# Patient Record
Sex: Female | Born: 1940 | Race: White | Hispanic: No | Smoking: Never smoker
Health system: Southern US, Community
[De-identification: ages and names within clinical notes are randomized; demographics above are authoritative.]

## PROBLEM LIST (undated history)

## (undated) DIAGNOSIS — Z9889 Other specified postprocedural states: Secondary | ICD-10-CM

## (undated) DIAGNOSIS — J9801 Acute bronchospasm: Secondary | ICD-10-CM

## (undated) DIAGNOSIS — E039 Hypothyroidism, unspecified: Secondary | ICD-10-CM

## (undated) DIAGNOSIS — I10 Essential (primary) hypertension: Secondary | ICD-10-CM

## (undated) DIAGNOSIS — R519 Headache, unspecified: Secondary | ICD-10-CM

## (undated) DIAGNOSIS — J449 Chronic obstructive pulmonary disease, unspecified: Secondary | ICD-10-CM

## (undated) DIAGNOSIS — M8580 Other specified disorders of bone density and structure, unspecified site: Secondary | ICD-10-CM

## (undated) HISTORY — DX: Essential (primary) hypertension: I10

## (undated) HISTORY — DX: Hypothyroidism, unspecified: E03.9

## (undated) HISTORY — DX: Other specified disorders of bone density and structure, unspecified site: M85.80

## (undated) HISTORY — DX: Chronic obstructive pulmonary disease, unspecified: J44.9

## (undated) HISTORY — PX: COLONOSCOPY: SHX174

## (undated) HISTORY — DX: Acute bronchospasm: J98.01

## (undated) HISTORY — PX: DILATION AND CURETTAGE OF UTERUS: SHX78

---

## 2010-03-16 ENCOUNTER — Ambulatory Visit
Admission: RE | Admit: 2010-03-16 | Discharge: 2010-03-16 | Payer: Self-pay | Source: Home / Self Care | Attending: Internal Medicine | Admitting: Internal Medicine

## 2010-03-16 ENCOUNTER — Ambulatory Visit: Admit: 2010-03-16 | Payer: Self-pay | Admitting: Internal Medicine

## 2010-03-28 ENCOUNTER — Other Ambulatory Visit: Payer: Self-pay

## 2010-03-28 ENCOUNTER — Ambulatory Visit (INDEPENDENT_AMBULATORY_CARE_PROVIDER_SITE_OTHER): Payer: Medicare HMO | Admitting: Internal Medicine

## 2010-03-28 ENCOUNTER — Other Ambulatory Visit: Payer: Self-pay | Admitting: Internal Medicine

## 2010-03-28 ENCOUNTER — Other Ambulatory Visit (HOSPITAL_COMMUNITY)
Admission: RE | Admit: 2010-03-28 | Discharge: 2010-03-28 | Disposition: A | Discharge status: Home / Self Care | Payer: Medicare HMO | Source: Ambulatory Visit | Attending: Internal Medicine | Admitting: Internal Medicine

## 2010-03-28 DIAGNOSIS — Z139 Encounter for screening, unspecified: Secondary | ICD-10-CM

## 2010-03-28 DIAGNOSIS — Z01419 Encounter for gynecological examination (general) (routine) without abnormal findings: Secondary | ICD-10-CM | POA: Insufficient documentation

## 2010-03-28 DIAGNOSIS — Z1159 Encounter for screening for other viral diseases: Secondary | ICD-10-CM | POA: Insufficient documentation

## 2010-04-27 ENCOUNTER — Ambulatory Visit (HOSPITAL_BASED_OUTPATIENT_CLINIC_OR_DEPARTMENT_OTHER)
Admission: RE | Admit: 2010-04-27 | Discharge: 2010-04-27 | Disposition: A | Discharge status: Home / Self Care | Payer: Medicare HMO | Source: Ambulatory Visit | Attending: Internal Medicine | Admitting: Internal Medicine

## 2010-04-27 DIAGNOSIS — Z139 Encounter for screening, unspecified: Secondary | ICD-10-CM

## 2010-04-27 DIAGNOSIS — Z1231 Encounter for screening mammogram for malignant neoplasm of breast: Secondary | ICD-10-CM | POA: Insufficient documentation

## 2010-07-29 ENCOUNTER — Encounter: Payer: Self-pay | Admitting: Internal Medicine

## 2010-10-03 ENCOUNTER — Other Ambulatory Visit: Payer: Self-pay | Admitting: Internal Medicine

## 2010-10-03 DIAGNOSIS — I10 Essential (primary) hypertension: Secondary | ICD-10-CM

## 2010-10-03 MED ORDER — LOSARTAN POTASSIUM-HCTZ 100-25 MG PO TABS: 1.0000 | ORAL_TABLET | Freq: Every day | ORAL | Status: DC

## 2010-10-03 MED ORDER — AMLODIPINE BESYLATE 2.5 MG PO TABS: 2.5000 mg | ORAL_TABLET | Freq: Every day | ORAL | Status: DC

## 2010-10-03 NOTE — Telephone Encounter (Signed)
Pt requests #90 with 3 refills to mail order pharmacy.  She also needs #5 of Losartan called to local pharamacy

## 2010-10-03 NOTE — Telephone Encounter (Signed)
Pt request refills on Lofartan/HCTZ 100-25 mg & Amiodipine 2.5 mg .  Would like 3 refills for 90 day prescription.  Uses [facility] mail order pharmacy.  Phone number [phone] & fax number [phone].  Also would like Lofartan/HCTZ 5 tablets called into [facility] [phone].  Pt phone number [phone].

## 2010-10-03 NOTE — Telephone Encounter (Signed)
Losartan/HCTZ 100/25mg  #[facility] called to [facility] per pt request

## 2010-10-10 ENCOUNTER — Telehealth: Payer: Self-pay | Admitting: Internal Medicine

## 2010-10-10 DIAGNOSIS — I10 Essential (primary) hypertension: Secondary | ICD-10-CM

## 2010-10-10 MED ORDER — LOSARTAN POTASSIUM-HCTZ 100-25 MG PO TABS: 1.0000 | ORAL_TABLET | Freq: Every day | ORAL | Status: DC

## 2010-10-10 NOTE — Telephone Encounter (Signed)
Pt needs 10 days supply Lofartan HCTZ 100-25 mg.  [facility] prescription is on delay they did an override.  [facility] phone number [phone].  Phone number to call pt back [phone].

## 2011-01-30 ENCOUNTER — Encounter: Payer: Self-pay | Admitting: Emergency Medicine

## 2011-01-30 ENCOUNTER — Ambulatory Visit (INDEPENDENT_AMBULATORY_CARE_PROVIDER_SITE_OTHER): Payer: Medicare HMO | Admitting: Emergency Medicine

## 2011-01-30 VITALS — BP 131/72 | HR 71 | Temp 97.0°F | Ht 67.5 in | Wt 113.0 lb

## 2011-01-30 DIAGNOSIS — Z23 Encounter for immunization: Secondary | ICD-10-CM

## 2011-01-30 NOTE — Progress Notes (Signed)
Cautioned Diane Bauer that it will take 2 weeks for full immune effect of vaccination.  Cautioned her to stay away from sick people, anyone with flu like symptoms- use good hand washing as her best protection.  She verbalized understanding

## 2011-03-21 ENCOUNTER — Other Ambulatory Visit: Payer: Self-pay | Admitting: Internal Medicine

## 2011-03-21 DIAGNOSIS — Z1231 Encounter for screening mammogram for malignant neoplasm of breast: Secondary | ICD-10-CM

## 2011-03-30 ENCOUNTER — Other Ambulatory Visit: Payer: Self-pay | Admitting: Internal Medicine

## 2011-04-05 ENCOUNTER — Telehealth: Payer: Self-pay | Admitting: Emergency Medicine

## 2011-04-05 NOTE — Telephone Encounter (Signed)
Diane Bauer called stating she had spoken with the internal medicine clinic at [facility] where she was seen previously.  She was told that the testing that needs to be done when a patient is on Armour thyroid is Free T4 STI, not TSH.  She is coming in on Friday for repeat labs.  What to draw?  Should I call Dr. Willeen Cass office to double check what they recommend drawing?  I did talk with her about going to see an endocrinologist, likely Dr. Talmage Nap, and she was fine with the idea, agreeing it was probably a good idea for her to see one.

## 2011-04-06 ENCOUNTER — Encounter: Payer: Medicare HMO | Admitting: Internal Medicine

## 2011-04-06 LAB — COMPREHENSIVE METABOLIC PANEL
AST: 31 U/L (ref 0–37)
Albumin: 4.5 g/dL (ref 3.5–5.2)
BUN: 12 mg/dL (ref 6–23)
CO2: 28 mEq/L (ref 19–32)
Calcium: 9.8 mg/dL (ref 8.4–10.5)
Chloride: 99 mEq/L (ref 96–112)
Glucose, Bld: 83 mg/dL (ref 70–99)
Potassium: 3.2 mEq/L — ABNORMAL LOW (ref 3.5–5.3)

## 2011-04-06 LAB — CBC WITH DIFFERENTIAL/PLATELET
HCT: 42.7 % (ref 36.0–46.0)
Hemoglobin: 14.3 g/dL (ref 12.0–15.0)
Lymphocytes Relative: 36 % (ref 12–46)
Lymphs Abs: 1.7 10*3/uL (ref 0.7–4.0)
Monocytes Absolute: 0.4 10*3/uL (ref 0.1–1.0)
Monocytes Relative: 8 % (ref 3–12)
Neutro Abs: 2.4 10*3/uL (ref 1.7–7.7)
WBC: 4.7 10*3/uL (ref 4.0–10.5)

## 2011-04-06 LAB — VITAMIN D 25 HYDROXY (VIT D DEFICIENCY, FRACTURES): Vit D, 25-Hydroxy: 40 ng/mL (ref 30–89)

## 2011-04-06 LAB — LIPID PANEL: LDL Cholesterol: 88 mg/dL (ref 0–99)

## 2011-04-06 LAB — TSH: TSH: 0.043 u[IU]/mL — ABNORMAL LOW (ref 0.350–4.500)

## 2011-04-06 NOTE — Telephone Encounter (Signed)
Go ahead and order Free T4 and Free T3 and thyroxine index.  Ok to repeat all former labs along with a fasting lipid profile.

## 2011-04-07 ENCOUNTER — Other Ambulatory Visit: Payer: Medicare HMO | Admitting: Emergency Medicine

## 2011-04-07 DIAGNOSIS — E039 Hypothyroidism, unspecified: Secondary | ICD-10-CM

## 2011-04-07 DIAGNOSIS — I10 Essential (primary) hypertension: Secondary | ICD-10-CM

## 2011-04-07 DIAGNOSIS — M8580 Other specified disorders of bone density and structure, unspecified site: Secondary | ICD-10-CM

## 2011-04-07 LAB — COMPREHENSIVE METABOLIC PANEL
ALT: 15 U/L (ref 0–35)
BUN: 11 mg/dL (ref 6–23)
CO2: 27 mEq/L (ref 19–32)
Calcium: 9.4 mg/dL (ref 8.4–10.5)
Creat: 0.77 mg/dL (ref 0.50–1.10)
Glucose, Bld: 82 mg/dL (ref 70–99)
Total Bilirubin: 1.6 mg/dL — ABNORMAL HIGH (ref 0.3–1.2)

## 2011-04-07 LAB — CBC WITH DIFFERENTIAL/PLATELET
Eosinophils Absolute: 0.1 10*3/uL (ref 0.0–0.7)
Eosinophils Relative: 4 % (ref 0–5)
HCT: 41.5 % (ref 36.0–46.0)
Lymphs Abs: 1.3 10*3/uL (ref 0.7–4.0)
MCH: 32.3 pg (ref 26.0–34.0)
MCV: 95.2 fL (ref 78.0–100.0)
Monocytes Absolute: 0.4 10*3/uL (ref 0.1–1.0)
Platelets: 173 10*3/uL (ref 150–400)
RBC: 4.36 MIL/uL (ref 3.87–5.11)

## 2011-04-07 LAB — LIPID PANEL
HDL: 72 mg/dL (ref >39)
LDL Cholesterol: 77 mg/dL (ref 0–99)
Total CHOL/HDL Ratio: 2.2 Ratio

## 2011-04-07 NOTE — Telephone Encounter (Signed)
Diane Bauer in the office this morning for labs

## 2011-04-08 LAB — FREE THYROXINE INDEX: Free Thyroxine Index: 3.9 (ref 1.0–3.9)

## 2011-04-08 LAB — VITAMIN D 25 HYDROXY (VIT D DEFICIENCY, FRACTURES): Vit D, 25-Hydroxy: 38 ng/mL (ref 30–89)

## 2011-04-08 LAB — T3, FREE: T3, Free: 5.4 pg/mL — ABNORMAL HIGH (ref 2.3–4.2)

## 2011-04-08 LAB — T4, FREE: Free T4: 1.33 ng/dL (ref 0.80–1.80)

## 2011-04-08 LAB — T3 UPTAKE: T3 Uptake: 34.9 % (ref 22.5–37.0)

## 2011-04-12 ENCOUNTER — Encounter: Payer: Self-pay | Admitting: Internal Medicine

## 2011-04-12 ENCOUNTER — Ambulatory Visit (INDEPENDENT_AMBULATORY_CARE_PROVIDER_SITE_OTHER): Payer: Medicare HMO | Admitting: Internal Medicine

## 2011-04-12 VITALS — BP 144/78 | HR 79 | Temp 97.2°F | Resp 12 | Ht 66.75 in | Wt 114.0 lb

## 2011-04-12 DIAGNOSIS — E039 Hypothyroidism, unspecified: Secondary | ICD-10-CM | POA: Insufficient documentation

## 2011-04-12 DIAGNOSIS — M899 Disorder of bone, unspecified: Secondary | ICD-10-CM

## 2011-04-12 DIAGNOSIS — R918 Other nonspecific abnormal finding of lung field: Secondary | ICD-10-CM

## 2011-04-12 DIAGNOSIS — M8580 Other specified disorders of bone density and structure, unspecified site: Secondary | ICD-10-CM | POA: Insufficient documentation

## 2011-04-12 DIAGNOSIS — R9389 Abnormal findings on diagnostic imaging of other specified body structures: Secondary | ICD-10-CM | POA: Insufficient documentation

## 2011-04-12 DIAGNOSIS — I10 Essential (primary) hypertension: Secondary | ICD-10-CM

## 2011-04-12 LAB — POCT URINALYSIS DIPSTICK
Ketones, UA: NEGATIVE
Protein, UA: NEGATIVE
Spec Grav, UA: <=1.005
pH, UA: 6.5

## 2011-04-12 NOTE — Patient Instructions (Signed)
Bring outpatient Bp to myoffice  See me in 3-4 months to check BP  Will refer to Dr. Talmage Nap endocrinology

## 2011-04-12 NOTE — Progress Notes (Signed)
Subjective:    Patient ID: Diane Bauer, female    DOB: 1940/02/21, 71 y.o.   MRN: [id number]  HPI  Diane Bauer is here for a comprehensive eval.  She  Is doing well and is up to date with screening exams  She reports her BP at home which she checks once a week is below 140 most of time.  She is unwilling to change the dose of her BP meds.  No Known Allergies Past Medical History  Diagnosis Date  . Hypertension   . Hypothyroidism   . Osteopenia   . COPD (chronic obstructive pulmonary disease)     by CXR  . Bronchospasm     occassional   Past Surgical History  Procedure Date  . Dilation and curettage of uterus     x2, secondary to menorrhagia   History   Social History  . Marital Status: Divorced    Spouse Name: N/A    Number of Children: 1  . Years of Education: college   Occupational History  . Freight forwarder    Social History Main Topics  . Smoking status: Never Smoker   . Smokeless tobacco: Never Used  . Alcohol Use: No  . Drug Use: No  . Sexually Active: Not Currently -- Female partner(s)   Other Topics Concern  . Not on file   Social History Narrative  . No narrative on file   Family History  Problem Relation Age of Onset  . Breast cancer Sister   . Breast cancer Paternal Aunt    Patient Active Problem List  Diagnoses  . Hypertension  . Hypothyroidism  . Osteopenia  . Abnormal CXR   Current Outpatient Prescriptions on File Prior to Visit  Medication Sig Dispense Refill  . amLODipine (NORVASC) 2.5 MG tablet Take 1 tablet (2.5 mg total) by mouth daily.  90 tablet  1  . aspirin 81 MG tablet Take 81 mg by mouth daily.        . cholecalciferol (VITAMIN D) 1000 UNITS tablet Take 1,000 Units by mouth daily.        Marland Kitchen losartan-hydrochlorothiazide (HYZAAR) 100-25 MG per tablet Take 1 tablet by mouth daily.  90 tablet  1  . Multiple Vitamin (MULTIVITAMIN) tablet Take 1 tablet by mouth daily.        Marland Kitchen thyroid (ARMOUR THYROID) 60 MG tablet Take 60 mg by mouth  2 (two) times daily.        . vitamin B-12 (CYANOCOBALAMIN) 50 MCG tablet Take 50 mcg by mouth daily.              Review of Systems  Constitutional: Negative for appetite change and unexpected weight change.  HENT: Negative for hearing loss, ear pain and tinnitus.   Eyes: Negative for pain, redness and visual disturbance.  Respiratory: Negative for cough, chest tightness and shortness of breath.   Cardiovascular: Negative for chest pain, palpitations and leg swelling.  Gastrointestinal: Negative for abdominal pain and blood in stool.  Genitourinary: Negative for dysuria, difficulty urinating and pelvic pain.  Musculoskeletal: Negative for joint swelling and arthralgias.  Neurological: Negative for weakness and headaches.       Objective:   Physical Exam Physical Exam  Nursing note and vitals reviewed.  Constitutional: She is oriented to person, place, and time. She appears well-developed and well-nourished.  HENT:  Head: Normocephalic and atraumatic.  Right Ear: Tympanic membrane and ear canal normal. No drainage. Tympanic membrane is not injected and not erythematous.  Left Ear: Tympanic  membrane and ear canal normal. No drainage. Tympanic membrane is not injected and not erythematous.  Nose: Nose normal. Right sinus exhibits no maxillary sinus tenderness and no frontal sinus tenderness. Left sinus exhibits no maxillary sinus tenderness and no frontal sinus tenderness.  Mouth/Throat: Oropharynx is clear and moist. No oral lesions. No oropharyngeal exudate.  Eyes: Conjunctivae and EOM are normal. Pupils are equal, round, and reactive to light.  Neck: Normal range of motion. Neck supple. No JVD present. Carotid bruit is not present. No mass and no thyromegaly present.  Cardiovascular: Normal rate, regular rhythm, S1 normal, S2 normal and intact distal pulses. Exam reveals no gallop and no friction rub.  No murmur heard.  Pulses:  Carotid pulses are 2+ on the right side, and 2+ on  the left side.  Dorsalis pedis pulses are 2+ on the right side, and 2+ on the left side.  No carotid bruit. No LE edema  Pulmonary/Chest: Breath sounds normal. She has no wheezes. She has no rales. She exhibits no tenderness. Breast no discrete masses no nipple discharge no axillary adenopathy bilaterally Abdominal: Soft. Bowel sounds are normal. She exhibits no distension and no mass. There is no hepatosplenomegaly. There is no tenderness. There is no CVA tenderness.  Rectal no mass guaiac neg Musculoskeletal: Normal range of motion.  No active synovitis to joints.  Lymphadenopathy:  She has no cervical adenopathy.  She has no axillary adenopathy.  Right: No inguinal and no supraclavicular adenopathy present.  Left: No inguinal and no supraclavicular adenopathy present.  Neurological: She is alert and oriented to person, place, and time. She has normal strength and normal reflexes. She displays no tremor. No cranial nerve deficit or sensory deficit. Coordination and gait normal.  Skin: Skin is warm and dry. No rash noted. No cyanosis. Nails show no clubbing.  Psychiatric: She has a normal mood and affect. Her speech is normal and behavior is normal. Cognition and memory are normal.          Assessment & Plan:  1) HTN: I asked pt to bring in record of outpt bp's 2)  Hypothyroid will refer to Dr. Talmage Nap as she is on Armour thyroid for any change in dosing 3)  Osteopenia  willl get DExa next year  Continue CAlcium Vitamin D  I spent 45 minutes with this pt

## 2011-04-17 ENCOUNTER — Other Ambulatory Visit: Payer: Self-pay | Admitting: Emergency Medicine

## 2011-04-17 DIAGNOSIS — I10 Essential (primary) hypertension: Secondary | ICD-10-CM

## 2011-04-17 MED ORDER — AMLODIPINE BESYLATE 2.5 MG PO TABS: 2.5000 mg | ORAL_TABLET | Freq: Every day | ORAL | Status: DC

## 2011-04-17 MED ORDER — LOSARTAN POTASSIUM-HCTZ 100-25 MG PO TABS: 1.0000 | ORAL_TABLET | Freq: Every day | ORAL | Status: DC

## 2011-04-17 NOTE — Telephone Encounter (Signed)
Diane Bauer called this morning to check the status of her medication refills on her blood pressure medications.  She requests a 90 day supply with 3 refills be called to [facility] as she is running low on her medications.  I advised her I would be happy to call in her for pending DDS approval this morning

## 2011-04-23 ENCOUNTER — Other Ambulatory Visit: Payer: Self-pay | Admitting: Internal Medicine

## 2011-04-24 NOTE — Telephone Encounter (Signed)
Spoke with Emmit Alexanders, certified pharmacy tech at [facility].  Given verbal order for both medications.

## 2011-05-01 ENCOUNTER — Ambulatory Visit (HOSPITAL_BASED_OUTPATIENT_CLINIC_OR_DEPARTMENT_OTHER)
Admission: RE | Admit: 2011-05-01 | Discharge: 2011-05-01 | Disposition: A | Discharge status: Home / Self Care | Payer: Medicare HMO | Source: Ambulatory Visit | Attending: Internal Medicine | Admitting: Internal Medicine

## 2011-05-01 DIAGNOSIS — Z1231 Encounter for screening mammogram for malignant neoplasm of breast: Secondary | ICD-10-CM

## 2011-07-05 ENCOUNTER — Encounter: Payer: Self-pay | Admitting: Internal Medicine

## 2011-07-05 ENCOUNTER — Ambulatory Visit (INDEPENDENT_AMBULATORY_CARE_PROVIDER_SITE_OTHER): Payer: Medicare HMO | Admitting: Internal Medicine

## 2011-07-05 VITALS — BP 128/76 | HR 72 | Temp 97.1°F | Resp 16 | Ht 67.0 in | Wt 114.0 lb

## 2011-07-05 DIAGNOSIS — E039 Hypothyroidism, unspecified: Secondary | ICD-10-CM

## 2011-07-05 DIAGNOSIS — I10 Essential (primary) hypertension: Secondary | ICD-10-CM

## 2011-07-05 DIAGNOSIS — R17 Unspecified jaundice: Secondary | ICD-10-CM | POA: Insufficient documentation

## 2011-07-05 NOTE — Progress Notes (Signed)
Subjective:    Patient ID: Diane Bauer, female    DOB: 23-Oct-1940, 71 y.o.   MRN: [id number]  HPI [name] is here for follow up.  She brings her BP moniter and SBP range from 112-140 .  The far majority of days systolic bp is normal  She is asymptomatic.   She has not seen an endocrinologist as yet  No Known Allergies Past Medical History  Diagnosis Date  . Hypertension   . Hypothyroidism   . Osteopenia   . COPD (chronic obstructive pulmonary disease)     by CXR  . Bronchospasm     occassional   Past Surgical History  Procedure Date  . Dilation and curettage of uterus     x2, secondary to menorrhagia   History   Social History  . Marital Status: Divorced    Spouse Name: N/A    Number of Children: 1  . Years of Education: college   Occupational History  . Freight forwarder    Social History Main Topics  . Smoking status: Never Smoker   . Smokeless tobacco: Never Used  . Alcohol Use: No  . Drug Use: No  . Sexually Active: Not Currently -- Female partner(s)   Other Topics Concern  . Not on file   Social History Narrative  . No narrative on file   Family History  Problem Relation Age of Onset  . Breast cancer Sister   . Breast cancer Paternal Aunt    Patient Active Problem List  Diagnoses  . Hypertension  . Hypothyroidism  . Osteopenia  . Abnormal CXR  . Elevated bilirubin   Current Outpatient Prescriptions on File Prior to Visit  Medication Sig Dispense Refill  . amLODipine (NORVASC) 2.5 MG tablet TAKE 1 TABLET BY MOUTH     DAILY  90 tablet  1  . aspirin 81 MG tablet Take 81 mg by mouth daily.        . cholecalciferol (VITAMIN D) 1000 UNITS tablet Take 1,000 Units by mouth daily.        Marland Kitchen losartan-hydrochlorothiazide (HYZAAR) 100-25 MG per tablet TAKE 1 TABLET BY MOUTH     DAILY  90 tablet  1  . thyroid (ARMOUR THYROID) 60 MG tablet Take 60 mg by mouth 2 (two) times daily.        . vitamin B-12 (CYANOCOBALAMIN) 50 MCG tablet Take 50 mcg by mouth daily.         . Multiple Vitamin (MULTIVITAMIN) tablet Take 1 tablet by mouth daily.             Review of Systems    see HPI Objective:   Physical Exam Physical Exam  Nursing note and vitals reviewed.  Constitutional: She is oriented to person, place, and time. She appears well-developed and well-nourished.  HENT:  Head: Normocephalic and atraumatic.  Cardiovascular: Normal rate and regular rhythm. Exam reveals no gallop and no friction rub.  No murmur heard.  Pulmonary/Chest: Breath sounds normal. She has no wheezes. She has no rales.  Neurological: She is alert and oriented to person, place, and time.  Skin: Skin is warm and dry.  Psychiatric: She has a normal mood and affect. Her behavior is normal.  Ext no edema         Assessment & Plan:  10  Htn  Well cntrolled on low dose amlodipine 2) Hypothyroidism on armour thyroid. OK to send to endocrinologist that is one her panel.  She is to check which is on her  insurance.   See me prn

## 2011-07-05 NOTE — Patient Instructions (Signed)
Office visit as needed

## 2011-08-14 ENCOUNTER — Ambulatory Visit: Payer: Medicare HMO | Admitting: Internal Medicine

## 2011-10-02 ENCOUNTER — Other Ambulatory Visit: Payer: Self-pay | Admitting: Internal Medicine

## 2011-10-02 DIAGNOSIS — E039 Hypothyroidism, unspecified: Secondary | ICD-10-CM

## 2011-10-16 ENCOUNTER — Other Ambulatory Visit: Payer: Self-pay | Admitting: Internal Medicine

## 2011-10-16 MED ORDER — LOSARTAN POTASSIUM-HCTZ 100-25 MG PO TABS: 90.0000 | ORAL_TABLET | Freq: Every day | ORAL | Status: DC

## 2011-10-16 MED ORDER — AMLODIPINE BESYLATE 2.5 MG PO TABS: 2.5000 mg | ORAL_TABLET | Freq: Every day | ORAL | Status: AC

## 2011-10-16 NOTE — Telephone Encounter (Signed)
Left message with pt to call office regarding thyroid medicine

## 2011-10-16 NOTE — Telephone Encounter (Signed)
Pt needs refill for 3 prescriptions.  For Losartan/HCTZ 100/25, Amolodipine Besylate 2.5 mg & Armour 60 mg twice a day.  Needs to be called into [facility] [phone] option 3.  Needs 2 90 day supply prescriptions.  Pt phone number [phone].

## 2011-10-17 ENCOUNTER — Telehealth: Payer: Self-pay | Admitting: Internal Medicine

## 2011-10-17 MED ORDER — THYROID 65 MG PO TABS: ORAL_TABLET | ORAL | Status: DC

## 2011-10-17 NOTE — Telephone Encounter (Signed)
Spoke with pt.  .  Dr. Evlyn Kanner not able to see pt.  Will refer to Dr. Warden Fillers in [place].  I advised pt that I will need to lower dose of Armour Thyroid  OK to give 3 month supply

## 2011-10-29 NOTE — Telephone Encounter (Signed)
error 

## 2011-10-30 ENCOUNTER — Telehealth: Payer: Self-pay | Admitting: Internal Medicine

## 2011-10-30 NOTE — Telephone Encounter (Signed)
Pt needs refills to [facility]: AmLODIPine Besylate (Tab) NORVASC 2.5 MG Take 1 tablet (2.5 mg total) by mouth daily. Losartan Potassium-HCTZ (Tab) HYZAAR 100-25 MG Take 90 tablets by mouth daily.    Per pt she is down to her last ten tablets... If there any questions or concerns please call pt.Marland KitchenMarland Kitchen

## 2011-11-07 ENCOUNTER — Other Ambulatory Visit: Payer: Self-pay | Admitting: Internal Medicine

## 2011-11-07 ENCOUNTER — Telehealth: Payer: Self-pay | Admitting: Internal Medicine

## 2011-11-07 NOTE — Telephone Encounter (Signed)
Pt is expressing concerns of why her Losartan Potassium-HCTZ (Tab) HYZAAR 100-25 MG Take 90 tablets by mouth daily. Has not been fill with [facility]... She states that the [facility] fax forms to office to have them fax back immediately... Pt is down to her last week of blood pressure.... Pt is asking for a call [phone]... Ad

## 2011-11-07 NOTE — Telephone Encounter (Signed)
Called pt regarding her refills as well as [facility], refills for norvasc and losartin verified and and called in.

## 2011-11-07 NOTE — Telephone Encounter (Signed)
Ok to call this in 

## 2011-11-13 ENCOUNTER — Other Ambulatory Visit: Payer: Self-pay | Admitting: *Deleted

## 2011-11-13 NOTE — Telephone Encounter (Signed)
Assured pt that medication had been called in

## 2011-11-13 NOTE — Telephone Encounter (Signed)
[facility] notified refill for 15 losartin called in

## 2011-12-24 ENCOUNTER — Other Ambulatory Visit: Payer: Self-pay | Admitting: Internal Medicine

## 2012-03-27 ENCOUNTER — Other Ambulatory Visit (HOSPITAL_BASED_OUTPATIENT_CLINIC_OR_DEPARTMENT_OTHER): Payer: Self-pay | Admitting: Family Medicine

## 2012-03-27 DIAGNOSIS — Z1231 Encounter for screening mammogram for malignant neoplasm of breast: Secondary | ICD-10-CM

## 2012-05-01 ENCOUNTER — Ambulatory Visit (HOSPITAL_BASED_OUTPATIENT_CLINIC_OR_DEPARTMENT_OTHER)
Admission: RE | Admit: 2012-05-01 | Discharge: 2012-05-01 | Disposition: A | Discharge status: Home / Self Care | Payer: Medicare HMO | Source: Ambulatory Visit | Attending: Family Medicine | Admitting: Family Medicine

## 2012-05-01 DIAGNOSIS — Z1231 Encounter for screening mammogram for malignant neoplasm of breast: Secondary | ICD-10-CM | POA: Insufficient documentation

## 2013-04-03 ENCOUNTER — Other Ambulatory Visit (HOSPITAL_BASED_OUTPATIENT_CLINIC_OR_DEPARTMENT_OTHER): Payer: Self-pay | Admitting: Family Medicine

## 2013-04-03 DIAGNOSIS — Z1231 Encounter for screening mammogram for malignant neoplasm of breast: Secondary | ICD-10-CM

## 2013-05-07 ENCOUNTER — Ambulatory Visit (HOSPITAL_BASED_OUTPATIENT_CLINIC_OR_DEPARTMENT_OTHER)
Admission: RE | Admit: 2013-05-07 | Discharge: 2013-05-07 | Disposition: A | Discharge status: Home / Self Care | Payer: Medicare HMO | Source: Ambulatory Visit | Attending: Family Medicine | Admitting: Family Medicine

## 2013-05-07 DIAGNOSIS — Z1231 Encounter for screening mammogram for malignant neoplasm of breast: Secondary | ICD-10-CM | POA: Insufficient documentation

## 2013-12-22 ENCOUNTER — Encounter: Payer: Self-pay | Admitting: Internal Medicine

## 2014-04-13 ENCOUNTER — Other Ambulatory Visit (HOSPITAL_BASED_OUTPATIENT_CLINIC_OR_DEPARTMENT_OTHER): Payer: Self-pay | Admitting: Family Medicine

## 2014-04-13 DIAGNOSIS — Z1231 Encounter for screening mammogram for malignant neoplasm of breast: Secondary | ICD-10-CM

## 2014-05-12 ENCOUNTER — Ambulatory Visit (HOSPITAL_BASED_OUTPATIENT_CLINIC_OR_DEPARTMENT_OTHER)
Admission: RE | Admit: 2014-05-12 | Discharge: 2014-05-12 | Disposition: A | Discharge status: Home / Self Care | Payer: Medicare HMO | Source: Ambulatory Visit | Attending: Family Medicine | Admitting: Family Medicine

## 2014-05-12 DIAGNOSIS — Z1231 Encounter for screening mammogram for malignant neoplasm of breast: Secondary | ICD-10-CM | POA: Diagnosis not present

## 2015-04-08 ENCOUNTER — Other Ambulatory Visit (HOSPITAL_BASED_OUTPATIENT_CLINIC_OR_DEPARTMENT_OTHER): Payer: Self-pay | Admitting: Family Medicine

## 2015-04-08 DIAGNOSIS — Z1231 Encounter for screening mammogram for malignant neoplasm of breast: Secondary | ICD-10-CM

## 2015-05-17 ENCOUNTER — Ambulatory Visit (HOSPITAL_BASED_OUTPATIENT_CLINIC_OR_DEPARTMENT_OTHER)
Admission: RE | Admit: 2015-05-17 | Discharge: 2015-05-17 | Disposition: A | Discharge status: Home / Self Care | Payer: Medicare HMO | Source: Ambulatory Visit | Attending: Family Medicine | Admitting: Family Medicine

## 2015-05-17 DIAGNOSIS — Z1231 Encounter for screening mammogram for malignant neoplasm of breast: Secondary | ICD-10-CM | POA: Diagnosis not present

## 2016-04-21 ENCOUNTER — Other Ambulatory Visit (HOSPITAL_BASED_OUTPATIENT_CLINIC_OR_DEPARTMENT_OTHER): Payer: Self-pay | Admitting: Family Medicine

## 2016-04-21 DIAGNOSIS — Z1231 Encounter for screening mammogram for malignant neoplasm of breast: Secondary | ICD-10-CM

## 2016-05-25 ENCOUNTER — Ambulatory Visit (HOSPITAL_BASED_OUTPATIENT_CLINIC_OR_DEPARTMENT_OTHER)
Admission: RE | Admit: 2016-05-25 | Discharge: 2016-05-25 | Disposition: A | Discharge status: Home / Self Care | Payer: Medicare HMO | Source: Ambulatory Visit | Attending: Family Medicine | Admitting: Family Medicine

## 2016-05-25 DIAGNOSIS — Z1231 Encounter for screening mammogram for malignant neoplasm of breast: Secondary | ICD-10-CM | POA: Insufficient documentation

## 2016-08-31 ENCOUNTER — Ambulatory Visit (INDEPENDENT_AMBULATORY_CARE_PROVIDER_SITE_OTHER): Payer: Medicare HMO | Admitting: Psychology

## 2016-08-31 DIAGNOSIS — F411 Generalized anxiety disorder: Secondary | ICD-10-CM

## 2017-04-17 ENCOUNTER — Other Ambulatory Visit (HOSPITAL_BASED_OUTPATIENT_CLINIC_OR_DEPARTMENT_OTHER): Payer: Self-pay | Admitting: Family Medicine

## 2017-04-17 DIAGNOSIS — Z1231 Encounter for screening mammogram for malignant neoplasm of breast: Secondary | ICD-10-CM

## 2017-05-30 ENCOUNTER — Encounter (HOSPITAL_BASED_OUTPATIENT_CLINIC_OR_DEPARTMENT_OTHER): Payer: Self-pay

## 2017-05-30 ENCOUNTER — Ambulatory Visit (HOSPITAL_BASED_OUTPATIENT_CLINIC_OR_DEPARTMENT_OTHER)
Admission: RE | Admit: 2017-05-30 | Discharge: 2017-05-30 | Disposition: A | Discharge status: Home / Self Care | Payer: Medicare HMO | Source: Ambulatory Visit | Attending: Family Medicine | Admitting: Family Medicine

## 2017-05-30 DIAGNOSIS — Z1231 Encounter for screening mammogram for malignant neoplasm of breast: Secondary | ICD-10-CM | POA: Diagnosis not present

## 2018-04-19 ENCOUNTER — Other Ambulatory Visit (HOSPITAL_BASED_OUTPATIENT_CLINIC_OR_DEPARTMENT_OTHER): Payer: Self-pay | Admitting: Family Medicine

## 2018-04-19 DIAGNOSIS — Z9289 Personal history of other medical treatment: Secondary | ICD-10-CM

## 2018-06-05 ENCOUNTER — Ambulatory Visit (HOSPITAL_BASED_OUTPATIENT_CLINIC_OR_DEPARTMENT_OTHER): Payer: Self-pay

## 2018-10-08 ENCOUNTER — Encounter (HOSPITAL_BASED_OUTPATIENT_CLINIC_OR_DEPARTMENT_OTHER): Payer: Self-pay

## 2018-10-08 ENCOUNTER — Ambulatory Visit (HOSPITAL_BASED_OUTPATIENT_CLINIC_OR_DEPARTMENT_OTHER)
Admission: RE | Admit: 2018-10-08 | Discharge: 2018-10-08 | Disposition: A | Discharge status: Home / Self Care | Payer: Medicare HMO | Source: Ambulatory Visit | Attending: Family Medicine | Admitting: Family Medicine

## 2018-10-08 ENCOUNTER — Other Ambulatory Visit: Payer: Self-pay

## 2018-10-08 DIAGNOSIS — R928 Other abnormal and inconclusive findings on diagnostic imaging of breast: Secondary | ICD-10-CM | POA: Diagnosis not present

## 2018-10-08 DIAGNOSIS — Z1231 Encounter for screening mammogram for malignant neoplasm of breast: Secondary | ICD-10-CM | POA: Diagnosis present

## 2018-10-08 DIAGNOSIS — Z9289 Personal history of other medical treatment: Secondary | ICD-10-CM

## 2018-10-10 ENCOUNTER — Other Ambulatory Visit: Payer: Self-pay | Admitting: Family Medicine

## 2018-10-10 DIAGNOSIS — R928 Other abnormal and inconclusive findings on diagnostic imaging of breast: Secondary | ICD-10-CM

## 2018-10-16 ENCOUNTER — Other Ambulatory Visit: Payer: Self-pay

## 2018-10-16 ENCOUNTER — Ambulatory Visit: Payer: Medicare HMO

## 2018-10-16 ENCOUNTER — Ambulatory Visit
Admission: RE | Admit: 2018-10-16 | Discharge: 2018-10-16 | Disposition: A | Discharge status: Home / Self Care | Payer: Medicare HMO | Source: Ambulatory Visit | Attending: Family Medicine | Admitting: Family Medicine

## 2018-10-16 DIAGNOSIS — R928 Other abnormal and inconclusive findings on diagnostic imaging of breast: Secondary | ICD-10-CM

## 2019-04-06 ENCOUNTER — Ambulatory Visit: Payer: Medicare HMO | Attending: Internal Medicine

## 2019-04-06 DIAGNOSIS — Z23 Encounter for immunization: Secondary | ICD-10-CM | POA: Insufficient documentation

## 2019-04-06 NOTE — Progress Notes (Signed)
   Covid-19 Vaccination Clinic  Name:  Diane Bauer    MRN: [id number] DOB: [birth year]  04/06/2019  [name] was observed post Covid-19 immunization for 15 minutes without incidence. She was provided with Vaccine Information Sheet and instruction to access the V-Safe system.   [name] was instructed to call 911 with any severe reactions post vaccine: Marland Kitchen Difficulty breathing  . Swelling of your face and throat  . A fast heartbeat  . A bad rash all over your body  . Dizziness and weakness    Immunizations Administered    Name Date Dose VIS Date Route   Pfizer COVID-19 Vaccine 04/06/2019  1:40 PM 0.3 mL 01/31/2019 Intramuscular   Manufacturer: [employer], [employer]   Lot: [id number]   NDC: [address]-0

## 2019-04-29 ENCOUNTER — Ambulatory Visit: Payer: Medicare HMO | Attending: Internal Medicine

## 2019-04-29 DIAGNOSIS — Z23 Encounter for immunization: Secondary | ICD-10-CM | POA: Insufficient documentation

## 2019-04-29 NOTE — Progress Notes (Signed)
   Covid-19 Vaccination Clinic  Name:  Diane Bauer    MRN: [id number] DOB: 02-Aug-1940  04/29/2019  [name] was observed post Covid-19 immunization for 15 minutes without incident. She was provided with Vaccine Information Sheet and instruction to access the V-Safe system.   [name] was instructed to call 911 with any severe reactions post vaccine: Marland Kitchen Difficulty breathing  . Swelling of face and throat  . A fast heartbeat  . A bad rash all over body  . Dizziness and weakness   Immunizations Administered    Name Date Dose VIS Date Route   Pfizer COVID-19 Vaccine 04/29/2019 11:45 AM 0.3 mL 01/31/2019 Intramuscular   Manufacturer: [employer], [employer]   Lot: [id number]   NDC: [address]-7

## 2019-04-30 ENCOUNTER — Ambulatory Visit: Payer: Medicare HMO

## 2019-12-06 ENCOUNTER — Ambulatory Visit: Payer: Medicare HMO

## 2019-12-06 ENCOUNTER — Ambulatory Visit: Payer: Medicare HMO | Attending: Internal Medicine

## 2019-12-06 DIAGNOSIS — Z23 Encounter for immunization: Secondary | ICD-10-CM

## 2019-12-06 NOTE — Progress Notes (Signed)
   Covid-19 Vaccination Clinic  Name:  Diane Bauer    MRN: [id number] DOB: 1940-07-10  12/06/2019  Diane Bauer was observed post Covid-19 immunization for 15 minutes without incident. She was provided with Vaccine Information Sheet and instruction to access the V-Safe system.   Diane Bauer was instructed to call 911 with any severe reactions post vaccine: Marland Kitchen Difficulty breathing  . Swelling of face and throat  . A fast heartbeat  . A bad rash all over body  . Dizziness and weakness

## 2020-02-16 IMAGING — MG DIGITAL DIAGNOSTIC UNILATERAL LEFT MAMMOGRAM WITH TOMO AND CAD
4 series · 4 of 12 positions shown · non-contrast
Comparison: Previous exam(s).

CLINICAL DATA: Screening recall for a possible left breast
asymmetry.

EXAM:
DIGITAL DIAGNOSTIC UNILATERAL LEFT MAMMOGRAM WITH CAD AND TOMO

[L MLO synth-2D]
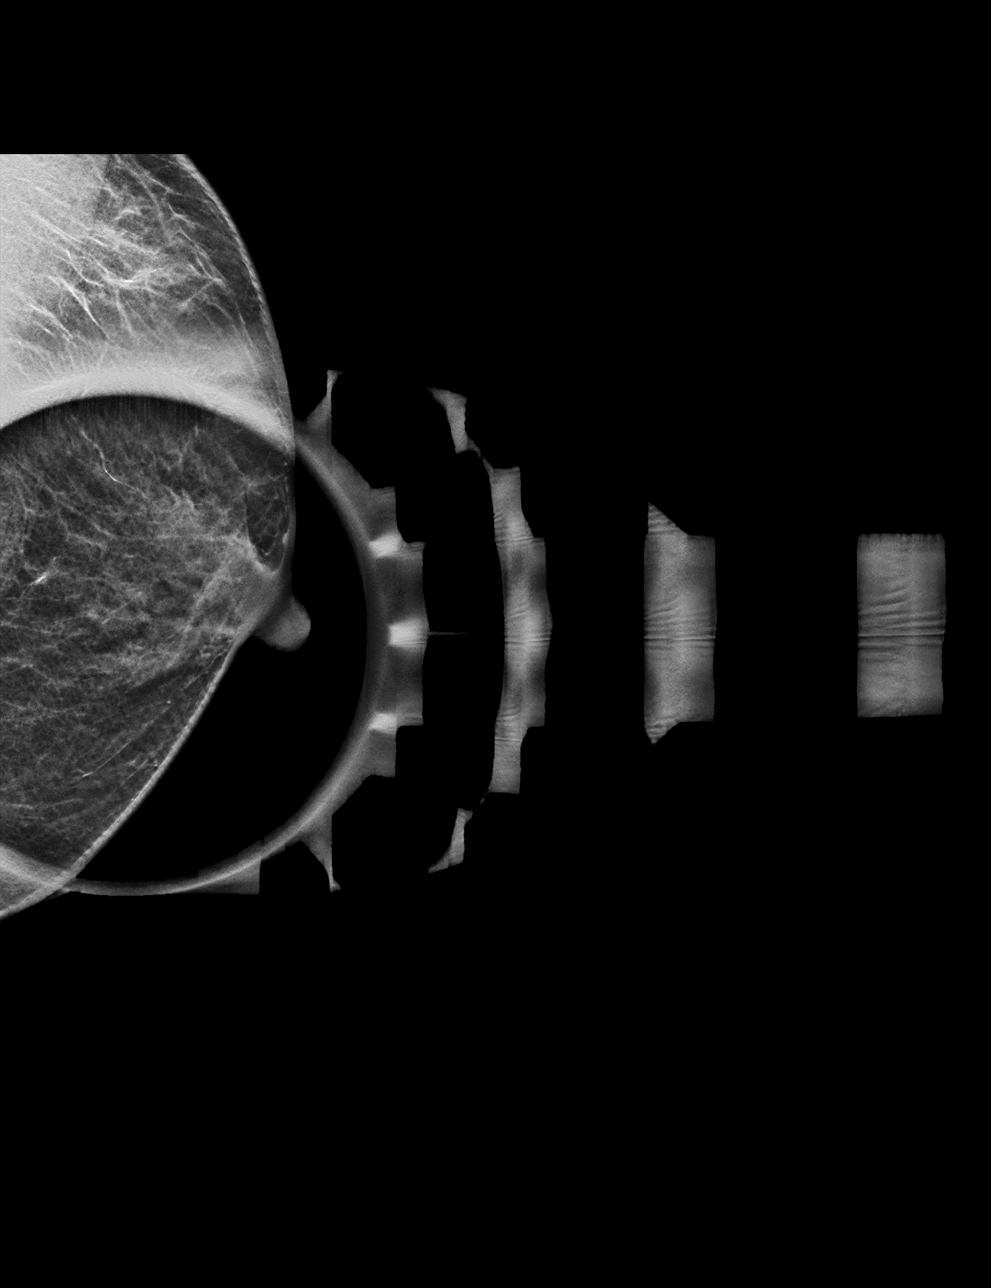

[L ML synth-2D]
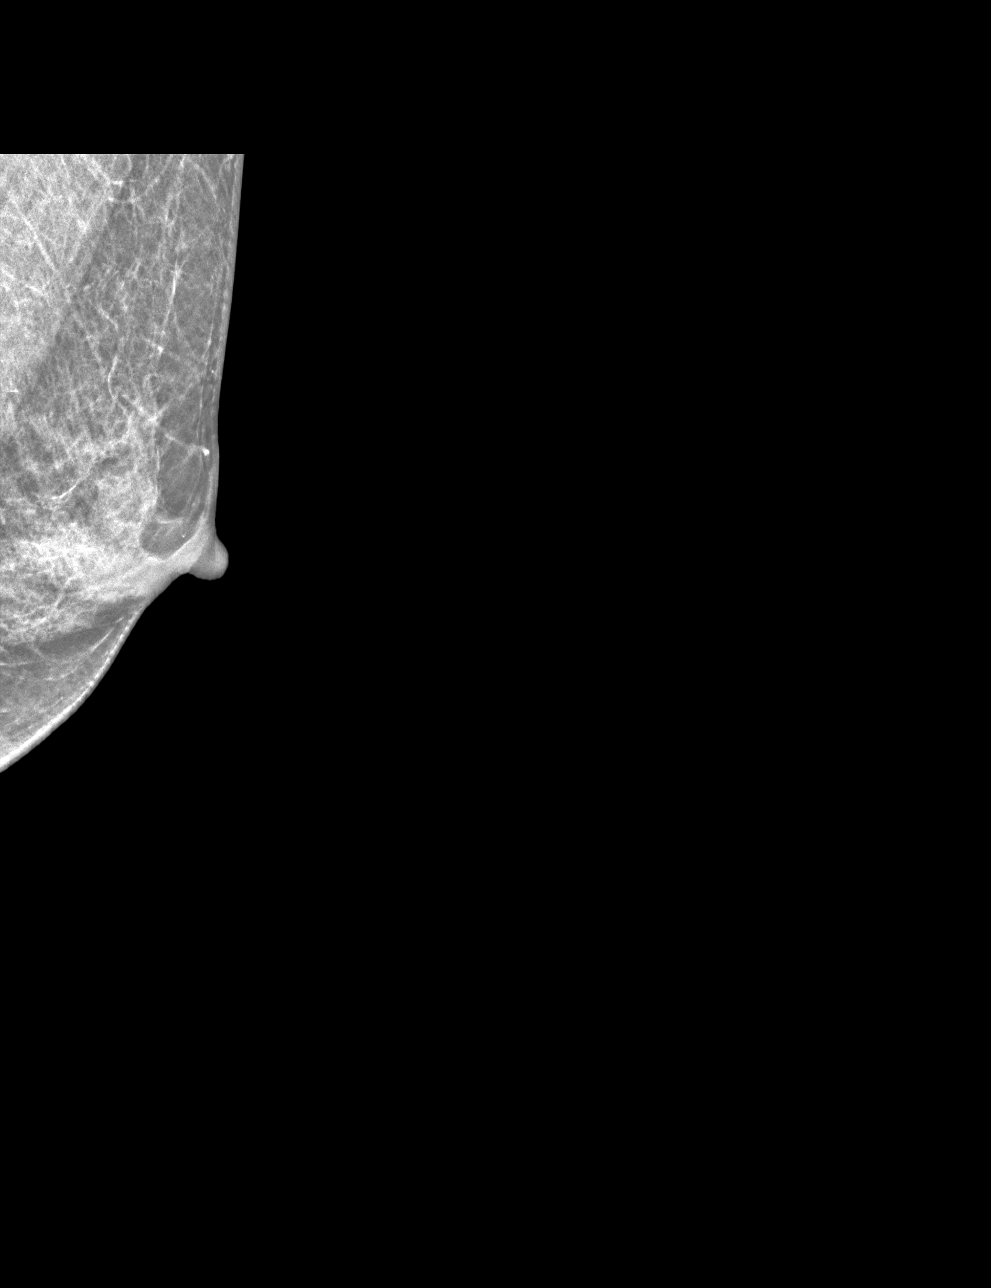

[L ML tomo · tomo slice 17/32.0]
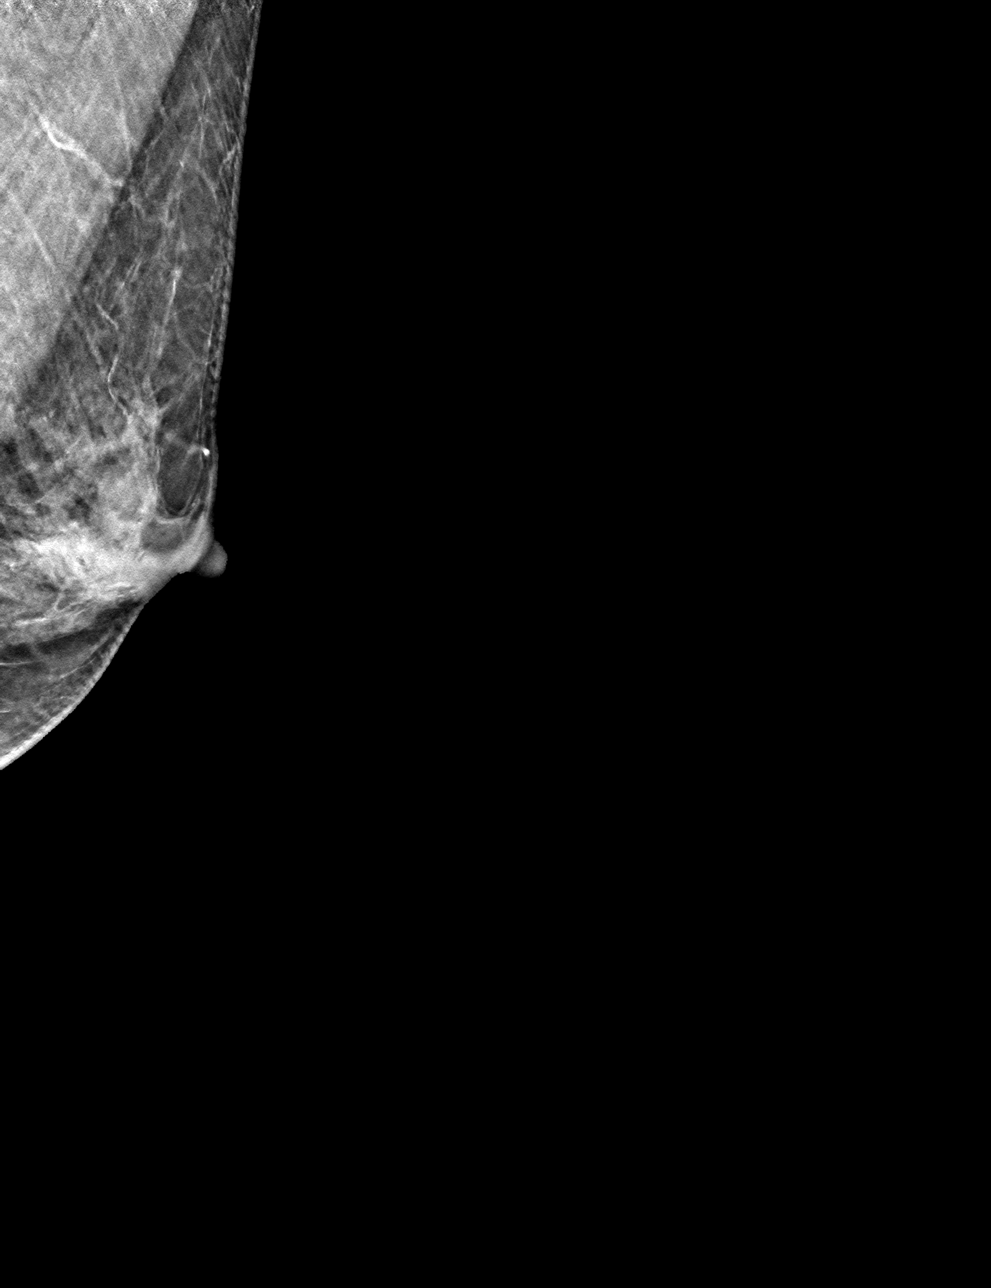

[L MLO tomo · tomo slice 15/28.0]
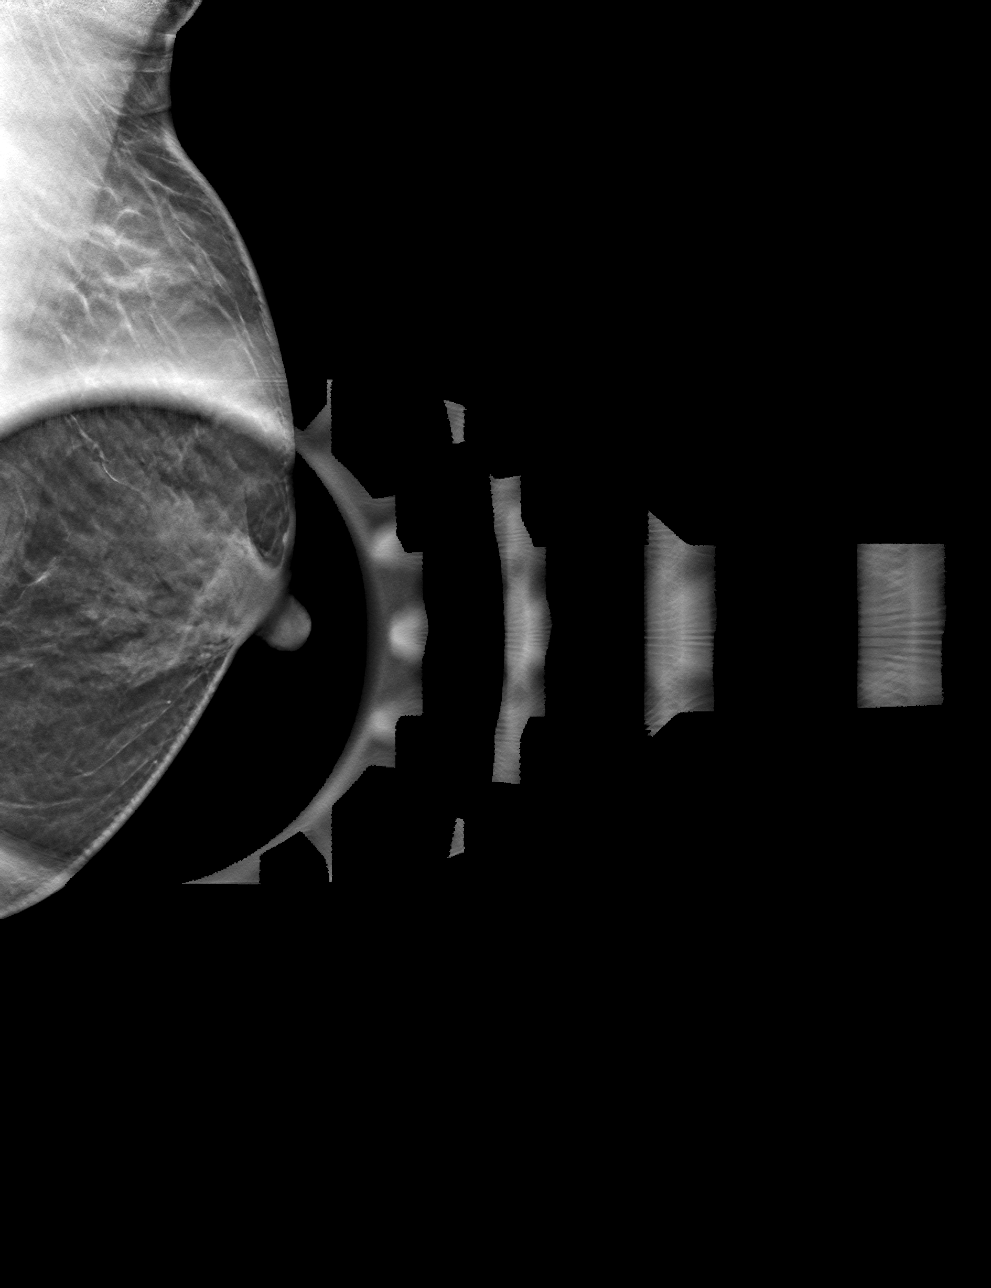

[4 of 12 positions shown; findings below may reference images not displayed]

ACR Breast Density Category c: The breast tissue is heterogeneously
dense, which may obscure small masses.
FINDINGS: The possible asymmetry disperses on spot compression imaging
consistent with superimposed fibroglandular tissue. There is no
underlying mass or significant residual asymmetry. There are no
areas of architectural distortion and no suspicious calcifications.

Mammographic images were processed with CAD.
IMPRESSION: 1. No evidence of breast carcinoma.

RECOMMENDATION:
Screening mammogram in one year.(Code:P5-A-EX0)

I have discussed the findings and recommendations with the patient.
Results were also provided in writing at the conclusion of the
visit. If applicable, a reminder letter will be sent to the patient
regarding the next appointment.

BI-RADS CATEGORY  1: Negative.

## 2020-07-14 ENCOUNTER — Ambulatory Visit: Payer: Medicare HMO

## 2020-07-20 ENCOUNTER — Other Ambulatory Visit: Payer: Self-pay

## 2020-07-20 ENCOUNTER — Ambulatory Visit: Payer: Self-pay | Attending: Internal Medicine

## 2020-07-20 DIAGNOSIS — Z23 Encounter for immunization: Secondary | ICD-10-CM

## 2020-07-20 NOTE — Progress Notes (Signed)
   Covid-19 Vaccination Clinic  Name:  Diane Bauer    MRN: [id number] DOB: [birth year]  07/20/2020  Diane Bauer was observed post Covid-19 immunization for 15 minutes without incident. She was provided with Vaccine Information Sheet and instruction to access the V-Safe system.   Diane Bauer was instructed to call 911 with any severe reactions post vaccine: Marland Kitchen Difficulty breathing  . Swelling of face and throat  . A fast heartbeat  . A bad rash all over body  . Dizziness and weakness   Immunizations Administered    Name Date Dose VIS Date Route   PFIZER Comrnaty(Gray TOP) Covid-19 Vaccine 07/20/2020  1:21 PM 0.3 mL 01/29/2020 Intramuscular   Manufacturer: [employer], [employer]   Lot: [id number]   NDC: [phone]

## 2021-03-25 ENCOUNTER — Ambulatory Visit: Payer: Self-pay | Attending: Internal Medicine

## 2021-03-25 ENCOUNTER — Other Ambulatory Visit: Payer: Self-pay

## 2021-03-25 DIAGNOSIS — Z23 Encounter for immunization: Secondary | ICD-10-CM

## 2021-03-28 ENCOUNTER — Other Ambulatory Visit (HOSPITAL_BASED_OUTPATIENT_CLINIC_OR_DEPARTMENT_OTHER): Payer: Self-pay

## 2021-03-28 MED ORDER — PFIZER COVID-19 VAC BIVALENT 30 MCG/0.3ML IM SUSP
INTRAMUSCULAR | 0 refills | Status: DC
  Filled 2021-03-28: qty 0.3, 1d supply, fill #0

## 2021-03-28 NOTE — Progress Notes (Signed)
° °  Covid-19 Vaccination Clinic  Name:  Diane Bauer    MRN: [id number] DOB: 1940/10/30  03/28/2021  [name] was observed post Covid-19 immunization for 15 minutes without incident. She was provided with Vaccine Information Sheet and instruction to access the V-Safe system.   [name] was instructed to call 911 with any severe reactions post vaccine: Difficulty breathing  Swelling of face and throat  A fast heartbeat  A bad rash all over body  Dizziness and weakness

## 2021-10-21 ENCOUNTER — Emergency Department (HOSPITAL_COMMUNITY): Payer: Medicare HMO | Admitting: Anesthesiology

## 2021-10-21 ENCOUNTER — Encounter (HOSPITAL_COMMUNITY): Payer: Self-pay

## 2021-10-21 ENCOUNTER — Encounter (HOSPITAL_COMMUNITY)
Admission: EM | Disposition: A | Discharge status: Home / Self Care | Payer: Self-pay | Source: Home / Self Care | Attending: Orthopedic Surgery

## 2021-10-21 ENCOUNTER — Emergency Department (HOSPITAL_COMMUNITY): Payer: Medicare HMO

## 2021-10-21 ENCOUNTER — Other Ambulatory Visit: Payer: Self-pay

## 2021-10-21 ENCOUNTER — Inpatient Hospital Stay (HOSPITAL_COMMUNITY)
Admission: EM | Admit: 2021-10-21 | Discharge: 2021-10-24 | DRG: 493 | Disposition: A | Discharge status: Home / Self Care | Payer: Medicare HMO | Attending: Orthopedic Surgery | Admitting: Orthopedic Surgery

## 2021-10-21 DIAGNOSIS — E039 Hypothyroidism, unspecified: Secondary | ICD-10-CM | POA: Diagnosis not present

## 2021-10-21 DIAGNOSIS — S42292A Other displaced fracture of upper end of left humerus, initial encounter for closed fracture: Secondary | ICD-10-CM | POA: Diagnosis present

## 2021-10-21 DIAGNOSIS — M62838 Other muscle spasm: Secondary | ICD-10-CM | POA: Diagnosis not present

## 2021-10-21 DIAGNOSIS — Z79899 Other long term (current) drug therapy: Secondary | ICD-10-CM

## 2021-10-21 DIAGNOSIS — Z880 Allergy status to penicillin: Secondary | ICD-10-CM

## 2021-10-21 DIAGNOSIS — I10 Essential (primary) hypertension: Secondary | ICD-10-CM | POA: Diagnosis not present

## 2021-10-21 DIAGNOSIS — J449 Chronic obstructive pulmonary disease, unspecified: Secondary | ICD-10-CM

## 2021-10-21 DIAGNOSIS — W101XXA Fall (on)(from) sidewalk curb, initial encounter: Secondary | ICD-10-CM | POA: Diagnosis present

## 2021-10-21 DIAGNOSIS — S82001A Unspecified fracture of right patella, initial encounter for closed fracture: Secondary | ICD-10-CM | POA: Diagnosis not present

## 2021-10-21 DIAGNOSIS — M8580 Other specified disorders of bone density and structure, unspecified site: Secondary | ICD-10-CM | POA: Diagnosis present

## 2021-10-21 DIAGNOSIS — Z803 Family history of malignant neoplasm of breast: Secondary | ICD-10-CM

## 2021-10-21 DIAGNOSIS — Z7982 Long term (current) use of aspirin: Secondary | ICD-10-CM

## 2021-10-21 DIAGNOSIS — S82041A Displaced comminuted fracture of right patella, initial encounter for closed fracture: Principal | ICD-10-CM | POA: Diagnosis present

## 2021-10-21 DIAGNOSIS — M160 Bilateral primary osteoarthritis of hip: Secondary | ICD-10-CM | POA: Diagnosis present

## 2021-10-21 DIAGNOSIS — W19XXXA Unspecified fall, initial encounter: Principal | ICD-10-CM

## 2021-10-21 HISTORY — PX: ORIF PATELLA: SHX5033

## 2021-10-21 LAB — COMPREHENSIVE METABOLIC PANEL
ALT: 12 U/L (ref 0–44)
AST: 31 U/L (ref 15–41)
Albumin: 3.7 g/dL (ref 3.5–5.0)
Alkaline Phosphatase: 76 U/L (ref 38–126)
Anion gap: 9 (ref 5–15)
BUN: 20 mg/dL (ref 8–23)
CO2: 24 mmol/L (ref 22–32)
Calcium: 9.1 mg/dL (ref 8.9–10.3)
Chloride: 99 mmol/L (ref 98–111)
Creatinine, Ser: 0.73 mg/dL (ref 0.44–1.00)
GFR, Estimated: >60 mL/min (ref >60)
Glucose, Bld: 109 mg/dL — ABNORMAL HIGH (ref 70–99)
Potassium: 3.5 mmol/L (ref 3.5–5.1)
Sodium: 132 mmol/L — ABNORMAL LOW (ref 135–145)
Total Bilirubin: 1.1 mg/dL (ref 0.3–1.2)
Total Protein: 6.7 g/dL (ref 6.5–8.1)

## 2021-10-21 LAB — CBC WITH DIFFERENTIAL/PLATELET
Abs Immature Granulocytes: 0.01 K/uL (ref 0.00–0.07)
Basophils Absolute: 0 K/uL (ref 0.0–0.1)
Basophils Relative: 1 %
Eosinophils Absolute: 0.1 K/uL (ref 0.0–0.5)
Eosinophils Relative: 1 %
HCT: 39.5 % (ref 36.0–46.0)
Hemoglobin: 13.5 g/dL (ref 12.0–15.0)
Immature Granulocytes: 0 %
Lymphocytes Relative: 29 %
Lymphs Abs: 1.8 K/uL (ref 0.7–4.0)
MCH: 34.5 pg — ABNORMAL HIGH (ref 26.0–34.0)
MCHC: 34.2 g/dL (ref 30.0–36.0)
MCV: 101 fL — ABNORMAL HIGH (ref 80.0–100.0)
Monocytes Absolute: 0.7 K/uL (ref 0.1–1.0)
Monocytes Relative: 11 %
Neutro Abs: 3.7 K/uL (ref 1.7–7.7)
Neutrophils Relative %: 58 %
Platelets: 203 K/uL (ref 150–400)
RBC: 3.91 MIL/uL (ref 3.87–5.11)
RDW: 12.3 % (ref 11.5–15.5)
WBC: 6.4 K/uL (ref 4.0–10.5)
nRBC: 0 % (ref 0.0–0.2)

## 2021-10-21 SURGERY — OPEN REDUCTION INTERNAL FIXATION (ORIF) PATELLA
Anesthesia: General | Site: Knee | Laterality: Right

## 2021-10-21 MED ORDER — LACTATED RINGERS IV SOLN
INTRAVENOUS | Status: DC
Start: 2021-10-21 — End: 2021-10-21

## 2021-10-21 MED ORDER — FENTANYL CITRATE PF 50 MCG/ML IJ SOSY
PREFILLED_SYRINGE | INTRAMUSCULAR | Status: AC
  Filled 2021-10-21: qty 2

## 2021-10-21 MED ORDER — ACETAMINOPHEN 10 MG/ML IV SOLN
INTRAVENOUS | Status: DC | PRN
  Administered 2021-10-21: 1000 mg via INTRAVENOUS

## 2021-10-21 MED ORDER — DOCUSATE SODIUM 100 MG PO CAPS
100.0000 mg | ORAL_CAPSULE | Freq: Two times a day (BID) | ORAL | Status: DC
  Administered 2021-10-21 – 2021-10-24 (×5): 100 mg via ORAL
  Filled 2021-10-21 (×5): qty 1

## 2021-10-21 MED ORDER — PHENYLEPHRINE HCL (PRESSORS) 10 MG/ML IV SOLN
INTRAVENOUS | Status: AC
  Filled 2021-10-21: qty 1

## 2021-10-21 MED ORDER — VANCOMYCIN HCL 1000 MG IV SOLR
INTRAVENOUS | Status: AC
  Filled 2021-10-21: qty 20

## 2021-10-21 MED ORDER — DEXAMETHASONE SODIUM PHOSPHATE 10 MG/ML IJ SOLN
INTRAMUSCULAR | Status: DC | PRN
  Administered 2021-10-21: 8 mg via INTRAVENOUS

## 2021-10-21 MED ORDER — LEVOTHYROXINE SODIUM 88 MCG PO TABS
88.0000 ug | ORAL_TABLET | Freq: Every day | ORAL | Status: DC
  Administered 2021-10-22 – 2021-10-24 (×3): 88 ug via ORAL

## 2021-10-21 MED ORDER — KCL IN DEXTROSE-NACL 20-5-0.45 MEQ/L-%-% IV SOLN
INTRAVENOUS | Status: DC
  Filled 2021-10-21 (×2): qty 1000

## 2021-10-21 MED ORDER — VITAMIN B-12 100 MCG PO TABS
50.0000 ug | ORAL_TABLET | Freq: Every day | ORAL | Status: DC
  Filled 2021-10-21 (×3): qty 1

## 2021-10-21 MED ORDER — ONDANSETRON HCL 4 MG/2ML IJ SOLN
4.0000 mg | Freq: Once | INTRAMUSCULAR | Status: AC
  Administered 2021-10-21: 4 mg via INTRAVENOUS
  Filled 2021-10-21: qty 2

## 2021-10-21 MED ORDER — LIDOCAINE 2% (20 MG/ML) 5 ML SYRINGE
INTRAMUSCULAR | Status: DC | PRN
  Administered 2021-10-21: 50 mg via INTRAVENOUS

## 2021-10-21 MED ORDER — ONDANSETRON HCL 4 MG/2ML IJ SOLN
INTRAMUSCULAR | Status: DC | PRN
  Administered 2021-10-21: 4 mg via INTRAVENOUS

## 2021-10-21 MED ORDER — CEFAZOLIN SODIUM-DEXTROSE 2-4 GM/100ML-% IV SOLN
2.0000 g | INTRAVENOUS | Status: AC
  Administered 2021-10-21: 2 g via INTRAVENOUS
  Filled 2021-10-21: qty 100

## 2021-10-21 MED ORDER — HYDROCODONE-ACETAMINOPHEN 7.5-325 MG PO TABS: 1.0000 | ORAL_TABLET | ORAL | Status: DC | PRN

## 2021-10-21 MED ORDER — PHENYLEPHRINE HCL-NACL 20-0.9 MG/250ML-% IV SOLN
INTRAVENOUS | Status: DC | PRN
  Administered 2021-10-21: 50 ug/min via INTRAVENOUS

## 2021-10-21 MED ORDER — AMISULPRIDE (ANTIEMETIC) 5 MG/2ML IV SOLN
5.0000 mg | Freq: Once | INTRAVENOUS | Status: AC | PRN
  Administered 2021-10-21: 5 mg via INTRAVENOUS

## 2021-10-21 MED ORDER — AMLODIPINE BESYLATE 5 MG PO TABS
2.5000 mg | ORAL_TABLET | Freq: Every day | ORAL | Status: DC
  Administered 2021-10-23: 2.5 mg via ORAL
  Filled 2021-10-21 (×2): qty 1

## 2021-10-21 MED ORDER — EPHEDRINE SULFATE-NACL 50-0.9 MG/10ML-% IV SOSY
PREFILLED_SYRINGE | INTRAVENOUS | Status: DC | PRN
  Administered 2021-10-21: 10 mg via INTRAVENOUS

## 2021-10-21 MED ORDER — BUPIVACAINE-EPINEPHRINE (PF) 0.25% -1:200000 IJ SOLN
INTRAMUSCULAR | Status: AC
  Filled 2021-10-21: qty 30

## 2021-10-21 MED ORDER — SORBITOL 70 % SOLN: 30.0000 mL | Freq: Every day | Status: DC | PRN

## 2021-10-21 MED ORDER — HYDROCHLOROTHIAZIDE 25 MG PO TABS
25.0000 mg | ORAL_TABLET | Freq: Every day | ORAL | Status: DC
  Filled 2021-10-21: qty 1

## 2021-10-21 MED ORDER — ROCURONIUM BROMIDE 10 MG/ML (PF) SYRINGE
PREFILLED_SYRINGE | INTRAVENOUS | Status: DC | PRN
  Administered 2021-10-21: 30 mg via INTRAVENOUS
  Administered 2021-10-21: 10 mg via INTRAVENOUS

## 2021-10-21 MED ORDER — PHENYLEPHRINE HCL (PRESSORS) 10 MG/ML IV SOLN
INTRAVENOUS | Status: DC | PRN
  Administered 2021-10-21: 80 ug via INTRAVENOUS

## 2021-10-21 MED ORDER — THYROID 60 MG PO TABS
65.0000 mg | ORAL_TABLET | Freq: Every day | ORAL | Status: DC
Start: 2021-10-22 — End: 2021-10-22
  Filled 2021-10-21: qty 1

## 2021-10-21 MED ORDER — HYDROMORPHONE HCL 1 MG/ML IJ SOLN
0.2500 mg | INTRAMUSCULAR | Status: DC | PRN
  Administered 2021-10-21: 0.25 mg via INTRAVENOUS

## 2021-10-21 MED ORDER — ONDANSETRON HCL 4 MG PO TABS: 4.0000 mg | ORAL_TABLET | Freq: Four times a day (QID) | ORAL | Status: DC | PRN

## 2021-10-21 MED ORDER — PROMETHAZINE HCL 25 MG/ML IJ SOLN
INTRAMUSCULAR | Status: AC
  Administered 2021-10-21: 6.25 mg via INTRAVENOUS
  Filled 2021-10-21: qty 1

## 2021-10-21 MED ORDER — FENTANYL CITRATE (PF) 100 MCG/2ML IJ SOLN
INTRAMUSCULAR | Status: DC | PRN
  Administered 2021-10-21 (×4): 25 ug via INTRAVENOUS

## 2021-10-21 MED ORDER — SUGAMMADEX SODIUM 200 MG/2ML IV SOLN
INTRAVENOUS | Status: DC | PRN
  Administered 2021-10-21: 200 mg via INTRAVENOUS

## 2021-10-21 MED ORDER — TRANEXAMIC ACID-NACL 1000-0.7 MG/100ML-% IV SOLN
1000.0000 mg | INTRAVENOUS | Status: AC
  Administered 2021-10-21: 1000 mg via INTRAVENOUS
  Filled 2021-10-21: qty 100

## 2021-10-21 MED ORDER — FENTANYL CITRATE PF 50 MCG/ML IJ SOSY: 25.0000 ug | PREFILLED_SYRINGE | Freq: Once | INTRAMUSCULAR | Status: DC

## 2021-10-21 MED ORDER — ONDANSETRON HCL 4 MG/2ML IJ SOLN
INTRAMUSCULAR | Status: AC
  Filled 2021-10-21: qty 2

## 2021-10-21 MED ORDER — LIDOCAINE 2% (20 MG/ML) 5 ML SYRINGE: INTRAMUSCULAR | Status: DC | PRN

## 2021-10-21 MED ORDER — LOSARTAN POTASSIUM-HCTZ 100-25 MG PO TABS
1.0000 | ORAL_TABLET | Freq: Every day | ORAL | Status: DC
Start: 2021-10-22 — End: 2021-10-21

## 2021-10-21 MED ORDER — HYDROCODONE-ACETAMINOPHEN 5-325 MG PO TABS
1.0000 | ORAL_TABLET | ORAL | Status: DC | PRN
  Administered 2021-10-22: 2 via ORAL
  Administered 2021-10-22 – 2021-10-24 (×7): 1 via ORAL
  Filled 2021-10-21: qty 2
  Filled 2021-10-21 (×5): qty 1
  Filled 2021-10-21: qty 2
  Filled 2021-10-21: qty 1

## 2021-10-21 MED ORDER — MORPHINE SULFATE (PF) 2 MG/ML IV SOLN: 0.5000 mg | INTRAVENOUS | Status: DC | PRN

## 2021-10-21 MED ORDER — LOSARTAN POTASSIUM 50 MG PO TABS
100.0000 mg | ORAL_TABLET | Freq: Every day | ORAL | Status: DC
  Administered 2021-10-23: 100 mg via ORAL
  Filled 2021-10-21: qty 2

## 2021-10-21 MED ORDER — SENNOSIDES-DOCUSATE SODIUM 8.6-50 MG PO TABS: 1.0000 | ORAL_TABLET | Freq: Every evening | ORAL | Status: DC | PRN

## 2021-10-21 MED ORDER — ACETAMINOPHEN 10 MG/ML IV SOLN
INTRAVENOUS | Status: AC
  Filled 2021-10-21: qty 100

## 2021-10-21 MED ORDER — DEXAMETHASONE SODIUM PHOSPHATE 10 MG/ML IJ SOLN
INTRAMUSCULAR | Status: AC
  Filled 2021-10-21: qty 1

## 2021-10-21 MED ORDER — VITAMIN D 25 MCG (1000 UNIT) PO TABS
1000.0000 [IU] | ORAL_TABLET | Freq: Every day | ORAL | Status: DC
  Administered 2021-10-23 – 2021-10-24 (×2): 1000 [IU] via ORAL
  Filled 2021-10-21 (×2): qty 1

## 2021-10-21 MED ORDER — SODIUM CHLORIDE 0.9 % IV BOLUS
1000.0000 mL | Freq: Once | INTRAVENOUS | Status: AC
  Administered 2021-10-21: 1000 mL via INTRAVENOUS

## 2021-10-21 MED ORDER — MORPHINE SULFATE (PF) 4 MG/ML IV SOLN
4.0000 mg | Freq: Once | INTRAVENOUS | Status: AC
  Administered 2021-10-21: 4 mg via INTRAVENOUS
  Filled 2021-10-21: qty 1

## 2021-10-21 MED ORDER — PROPOFOL 10 MG/ML IV BOLUS
INTRAVENOUS | Status: DC | PRN
  Administered 2021-10-21: 90 mg via INTRAVENOUS

## 2021-10-21 MED ORDER — HYDROMORPHONE HCL 1 MG/ML IJ SOLN
INTRAMUSCULAR | Status: AC
  Administered 2021-10-21: 0.25 mg via INTRAVENOUS
  Filled 2021-10-21: qty 1

## 2021-10-21 MED ORDER — FENTANYL CITRATE (PF) 100 MCG/2ML IJ SOLN
INTRAMUSCULAR | Status: AC
  Filled 2021-10-21: qty 2

## 2021-10-21 MED ORDER — POVIDONE-IODINE 10 % EX SWAB
2.0000 | Freq: Once | CUTANEOUS | Status: AC
Start: 2021-10-21 — End: 2021-10-21
  Administered 2021-10-21: 2 via TOPICAL

## 2021-10-21 MED ORDER — ACETAMINOPHEN 325 MG PO TABS: 325.0000 mg | ORAL_TABLET | Freq: Four times a day (QID) | ORAL | Status: DC | PRN

## 2021-10-21 MED ORDER — AMISULPRIDE (ANTIEMETIC) 5 MG/2ML IV SOLN
INTRAVENOUS | Status: AC
  Filled 2021-10-21: qty 4

## 2021-10-21 MED ORDER — PROPOFOL 10 MG/ML IV BOLUS
INTRAVENOUS | Status: AC
  Filled 2021-10-21: qty 20

## 2021-10-21 MED ORDER — FLEET ENEMA 7-19 GM/118ML RE ENEM: 1.0000 | ENEMA | Freq: Once | RECTAL | Status: DC | PRN

## 2021-10-21 MED ORDER — ONDANSETRON HCL 4 MG/2ML IJ SOLN: 4.0000 mg | Freq: Four times a day (QID) | INTRAMUSCULAR | Status: DC | PRN

## 2021-10-21 MED ORDER — PROMETHAZINE HCL 25 MG/ML IJ SOLN: 6.2500 mg | Freq: Once | INTRAMUSCULAR | Status: AC

## 2021-10-21 SURGICAL SUPPLY — 54 items
BAG COUNTER SPONGE SURGICOUNT (BAG) IMPLANT
BAG ZIPLOCK 12X15 (MISCELLANEOUS) ×1 IMPLANT
BANDAGE ESMARK 6X9 LF (GAUZE/BANDAGES/DRESSINGS) ×1 IMPLANT
BIT DRILL CANN 2.7X625 NONSTRL (BIT) IMPLANT
BNDG ELASTIC 6X5.8 VLCR STR LF (GAUZE/BANDAGES/DRESSINGS) IMPLANT
BNDG ESMARK 6X9 LF (GAUZE/BANDAGES/DRESSINGS)
COVER SURGICAL LIGHT HANDLE (MISCELLANEOUS) ×1 IMPLANT
CUFF TOURN SGL QUICK 34 (TOURNIQUET CUFF) ×1
CUFF TRNQT CYL 34X4.125X (TOURNIQUET CUFF) ×1 IMPLANT
DRAPE C-ARM 42X120 X-RAY (DRAPES) ×1 IMPLANT
DRAPE C-ARMOR (DRAPES) IMPLANT
DRAPE SHEET LG 3/4 BI-LAMINATE (DRAPES) ×1 IMPLANT
DRAPE U-SHAPE 47X51 STRL (DRAPES) ×1 IMPLANT
DRSG AQUACEL AG ADV 3.5X 6 (GAUZE/BANDAGES/DRESSINGS) IMPLANT
DRSG EMULSION OIL 3X16 NADH (GAUZE/BANDAGES/DRESSINGS) ×1 IMPLANT
DURAPREP 26ML APPLICATOR (WOUND CARE) ×1 IMPLANT
ELECT REM PT RETURN 15FT ADLT (MISCELLANEOUS) ×1 IMPLANT
GLOVE BIO SURGEON STRL SZ7.5 (GLOVE) ×1 IMPLANT
GLOVE BIO SURGEON STRL SZ8 (GLOVE) ×1 IMPLANT
GLOVE BIOGEL PI IND STRL 8 (GLOVE) ×2 IMPLANT
GLOVE BIOGEL PI INDICATOR 8 (GLOVE) ×2
GOWN STRL REUS W/ TWL LRG LVL3 (GOWN DISPOSABLE) ×1 IMPLANT
GOWN STRL REUS W/ TWL XL LVL3 (GOWN DISPOSABLE) ×1 IMPLANT
GOWN STRL REUS W/TWL LRG LVL3 (GOWN DISPOSABLE)
GOWN STRL REUS W/TWL XL LVL3 (GOWN DISPOSABLE) ×2
GUIDEWIRE THREADED 150MM (WIRE) IMPLANT
IMMOBILIZER KNEE 22 UNIV (SOFTGOODS) IMPLANT
KIT BASIN OR (CUSTOM PROCEDURE TRAY) ×1 IMPLANT
KIT TURNOVER KIT A (KITS) IMPLANT
NS IRRIG 1000ML POUR BTL (IV SOLUTION) ×1 IMPLANT
PACK TOTAL JOINT (CUSTOM PROCEDURE TRAY) ×1 IMPLANT
PAD CAST 4YDX4 CTTN HI CHSV (CAST SUPPLIES) ×2 IMPLANT
PADDING CAST COTTON 4X4 STRL (CAST SUPPLIES)
PADDING CAST COTTON 6X4 STRL (CAST SUPPLIES) IMPLANT
PROTECTOR NERVE ULNAR (MISCELLANEOUS) ×1 IMPLANT
SCISSORS WIRE CUT 4.75 STRL (MISCELLANEOUS) IMPLANT
SCREW CANN S THRD/36 4.0 (Screw) IMPLANT
SCREW SHORT THREAD 4.0X40 (Screw) IMPLANT
STOCKINETTE 8 INCH (MISCELLANEOUS) ×1 IMPLANT
STRIP CLOSURE SKIN 1/2X4 (GAUZE/BANDAGES/DRESSINGS) ×1 IMPLANT
SUCTION FRAZIER HANDLE 10FR (MISCELLANEOUS) ×1
SUCTION TUBE FRAZIER 10FR DISP (MISCELLANEOUS) ×1 IMPLANT
SUT ETHILON 4 0 PS 2 18 (SUTURE) IMPLANT
SUT MNCRL AB 3-0 PS2 18 (SUTURE) IMPLANT
SUT MNCRL AB 4-0 PS2 18 (SUTURE) ×1 IMPLANT
SUT STEEL 7 (SUTURE) IMPLANT
SUT STRATAFIX 1PDS 45CM VIOLET (SUTURE) IMPLANT
SUT VIC AB 0 CT1 18XCR BRD 8 (SUTURE) IMPLANT
SUT VIC AB 0 CT1 8-18 (SUTURE) ×1
SUT VIC AB 1 CT1 27 (SUTURE)
SUT VIC AB 1 CT1 27XBRD ANTBC (SUTURE) ×5 IMPLANT
SUT VIC AB 2-0 CT2 27 (SUTURE) ×1 IMPLANT
TOWEL OR 17X26 10 PK STRL BLUE (TOWEL DISPOSABLE) ×2 IMPLANT
WATER STERILE IRR 1000ML POUR (IV SOLUTION) ×1 IMPLANT

## 2021-10-21 NOTE — Op Note (Signed)
10/21/2021  8:32 PM  PATIENT:  Diane Bauer  81 y.o. female  PRE-OPERATIVE DIAGNOSIS:  right patella fracture  POST-OPERATIVE DIAGNOSIS:  right patella fracture  Procedure(s): Open treatment right patella fracture with internal fixation  SURGEON:  Toni Arthurs, MD  ASSISTANT: None  ANESTHESIA:   General, regional  EBL:  minimal   TOURNIQUET: None  COMPLICATIONS:  None apparent  DISPOSITION:  Extubated, awake and stable to recovery.  INDICATION FOR PROCEDURE: 81 year old female without significant past medical history fell earlier today after leaving a bookstore.  She was seen in the emergency room at [facility] with a chief complaint of left shoulder and right knee pain.  Radiographs reveal a transverse fracture of her patella that is significantly displaced.  She presents now for operative treatment of this displaced intra-articular fracture of her right knee.  The risks and benefits of the alternative treatment options have been discussed in detail.  The patient wishes to proceed with surgery and specifically understands risks of bleeding, infection, nerve damage, blood clots, need for additional surgery, amputation and death.   PROCEDURE IN DETAIL: After preoperative consent was obtained and the correct operative site was identified, the patient was brought to the operating room and placed upon the operating table.  General anesthesia was induced.  Preoperative antibiotics were administered.  Surgical timeout was taken.  The right lower extremity was prepped and draped in standard sterile fashion with a tourniquet around the thigh.  Tranexamic acid was administered intravenously.  A longitudinal incision was made in the midline over the patella.  Dissection was carried sharply down through the subcutaneous tissues.  The extensor retinaculum was incised longitudinally and elevated medially and laterally.  The fracture site was identified.  It was cleaned of all hematoma.  The  knee joint was irrigated copiously.  The extensor retinaculum was noted to be disrupted to the medial and lateral gutters.  0 Vicryl sutures were placed in the retinaculum from the corners to the edge of the patella and tagged for later tying.  The fracture was then reduced and held provisionally with a Weber tenaculum.  AP and lateral radiographs confirmed appropriate reduction of the fracture.  K wires were inserted from distal to proximal across the fracture line.  Synthes cannulated stainless steel screws were inserted after measuring drilling over the K wires.  Both were noted to have adequate purchase.  K wires were removed.  18-gauge surgical steel wire was placed through the cannulated screws in a figure-of-eight tension band pattern over the anterior aspect of the patella.  A loop of wire medially was used to tension medially and the free ends of the wire were used to tighten the figure-of-eight laterally.  AP and lateral radiographs confirmed appropriate reduction of the fracture in appropriate position of all hardware.  The knee could be flexed to 90 degrees without evident displacement or gapping at the fracture site.  The wound was irrigated again copiously.  The previously placed Vicryl sutures in the retinaculum were tied securely.  The longitudinal incision in the retinaculum was repaired with inverted simple sutures of 0 Vicryl.  Subcutaneous tissues were approximated with 3-0 Monocryl.  The incision was then closed with a running barbed PDS suture.  Steri-Strips were applied over the incision followed by a Aquacel dressing.  A compression wrap was applied followed by 6 inch Ace wrap and a knee immobilizer.  The patient was awakened from anesthesia and transported to the recovery room in stable condition.   FOLLOW  UP PLAN: Weightbearing as tolerated on the right lower extremity in the knee immobilizer.  Aspirin for DVT prophylaxis.  Physical therapy and Occupational Therapy consultation.

## 2021-10-21 NOTE — Anesthesia Preprocedure Evaluation (Addendum)
Anesthesia Evaluation  Patient identified by MRN, date of birth, ID band Patient awake    Reviewed: Allergy & Precautions, NPO status , Patient's Chart, lab work & pertinent test results  Airway Mallampati: II  TM Distance: >3 FB Neck ROM: Full    Dental  (+) Dental Advisory Given, Teeth Intact, Caps   Pulmonary COPD,    Pulmonary exam normal breath sounds clear to auscultation       Cardiovascular hypertension, Pt. on medications Normal cardiovascular exam Rhythm:Regular Rate:Normal     Neuro/Psych negative neurological ROS     GI/Hepatic negative GI ROS, Neg liver ROS,   Endo/Other  Hypothyroidism   Renal/GU negative Renal ROS     Musculoskeletal negative musculoskeletal ROS (+)   Abdominal   Peds  Hematology negative hematology ROS (+)   Anesthesia Other Findings   Reproductive/Obstetrics                            Anesthesia Physical Anesthesia Plan  ASA: 3  Anesthesia Plan: General   Post-op Pain Management: Tylenol PO (pre-op)*, Celebrex PO (pre-op)* and Regional block*   Induction: Intravenous  PONV Risk Score and Plan: 4 or greater and Ondansetron, Dexamethasone and Treatment may vary due to age or medical condition  Airway Management Planned: Oral ETT  Additional Equipment:   Intra-op Plan:   Post-operative Plan: Extubation in OR  Informed Consent: I have reviewed the patients History and Physical, chart, labs and discussed the procedure including the risks, benefits and alternatives for the proposed anesthesia with the patient or authorized representative who has indicated his/her understanding and acceptance.     Dental advisory given  Plan Discussed with: CRNA  Anesthesia Plan Comments:        Anesthesia Quick Evaluation

## 2021-10-21 NOTE — Anesthesia Procedure Notes (Signed)
Anesthesia Regional Block: Adductor canal block   Pre-Anesthetic Checklist: , timeout performed,  Correct Patient, Correct Site, Correct Laterality,  Correct Procedure, Correct Position, site marked,  Risks and benefits discussed,  Surgical consent,  Pre-op evaluation,  At surgeon's request and post-op pain management  Laterality: Lower  Prep: chloraprep       Needles:  Injection technique: Single-shot  Needle Type: Stimiplex     Needle Length: 9cm  Needle Gauge: 21     Additional Needles:   Procedures:,,,, ultrasound used (permanent image in chart),,    Narrative:  Start time: 10/21/2021 6:25 PM End time: 10/21/2021 6:45 PM Injection made incrementally with aspirations every 5 mL.  Performed by: Personally  Anesthesiologist: Lewie Loron, MD  Additional Notes: BP cuff, EKG monitors applied. Sedation begun. Artery and nerve location verified with ultrasound. Anesthetic injected incrementally (65ml), slowly, and after negative aspirations under direct u/s guidance. Good fascial/perineural spread. Tolerated well.

## 2021-10-21 NOTE — Progress Notes (Signed)
Orthopedic Tech Progress Note Patient Details:  Diane Bauer 1940-05-20 [id number]  Ortho Devices Type of Ortho Device: Arm sling Ortho Device/Splint Location: LUE Ortho Device/Splint Interventions: Application   Post Interventions Patient Tolerated: Well  Genelle Bal Diane Bauer 10/21/2021, 5:50 PM

## 2021-10-21 NOTE — H&P (Signed)
Diane Bauer is an 81 y.o. female.   Chief Complaint: right knee and left shoulder pain HPI: 81 y/o female wtihout signficiant PMH c/o R knee pain and left shoulder pain after a fall earlier today.  She c/o pain with motion at either location.  No h/o diabetes.  She is not a smoker.  She last ate at 10:30.  Her son is with her at bedside.  Past Medical History:  Diagnosis Date   Bronchospasm    occassional   COPD (chronic obstructive pulmonary disease) (HCC)    by CXR   Hypertension    Hypothyroidism    Osteopenia     Past Surgical History:  Procedure Laterality Date   DILATION AND CURETTAGE OF UTERUS     x2, secondary to menorrhagia    Family History  Problem Relation Age of Onset   Breast cancer Sister    Breast cancer Paternal Aunt    Social History:  reports that she has never smoked. She has never used smokeless tobacco. She reports that she does not drink alcohol and does not use drugs.  Allergies: No Known Allergies  (Not in a hospital admission)   No results found for this or any previous visit (from the past 48 hour(s)). DG Chest 1 View  Result Date: 10/21/2021 CLINICAL DATA:  Mechanical fall EXAM: CHEST  1 VIEW COMPARISON:  None Available. FINDINGS: The cardiomediastinal silhouette is within normal limits. There is no focal airspace consolidation. There is no pleural effusion. No pneumothorax. Biapical pleuroparenchymal scarring. Probable calcified granuloma in the medial right lower lung. There is a comminuted displaced fracture of the proximal humerus at the surgical neck. IMPRESSION: Comminuted displaced fracture of the left proximal humerus at the surgical neck. No acute cardiopulmonary disease. Electronically Signed   By: Caprice Renshaw M.D.   On: 10/21/2021 16:21   DG Pelvis 1-2 Views  Result Date: 10/21/2021 CLINICAL DATA:  Mechanical fall EXAM: PELVIS - 1-2 VIEW COMPARISON:  None Available. FINDINGS: There is no evidence of acute fracture. Alignment is  normal. 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 IMPRESSION: No evidence of acute fracture on single frontal view of the pelvis. Mild bilateral hip osteoarthritis. Electronically Signed   By: Caprice Renshaw M.D.   On: 10/21/2021 16:19   DG Knee Complete 4 Views Right  Result Date: 10/21/2021 CLINICAL DATA:  Pain and bruising after mechanical fall today. EXAM: RIGHT KNEE - COMPLETE 4+ VIEW COMPARISON:  None Available. FINDINGS: There is a displaced fracture of the mid patella with 1.8 cm distraction of fracture fragments. The fracture is minimally comminuted involving the inferior fracture fragment at the dominant fracture plane. Associated prepatellar soft tissue edema and small joint effusion. No additional fracture of the knee. Normal tibiofemoral alignment. IMPRESSION: Displaced mid-patellar fracture with 1.8 cm distraction of fracture fragments. Electronically Signed   By: Narda Rutherford M.D.   On: 10/21/2021 16:17   DG Shoulder Left  Result Date: 10/21/2021 CLINICAL DATA:  Pain, mechanical fall today.  Left shoulder pain. EXAM: LEFT SHOULDER - 2+ VIEW COMPARISON:  None Available. FINDINGS: Comminuted and displaced fracture through the proximal humerus. Dominant fracture plane involves the surgical neck. There is medial displacement of a 3.5 cm butterfly fragment. Glenohumeral joint is congruent. Acromioclavicular joint is congruent. No visualized fractures of the included ribs. IMPRESSION: Comminuted and displaced proximal humerus fracture involving the surgical neck. Electronically Signed   By: Narda Rutherford M.D.   On: 10/21/2021 16:15    Review of Systems  no recent f/c/n/v/wt loss  Blood pressure (!) 141/59, pulse 70, temperature 97.7 F (36.5 C), temperature source Oral, resp. rate 16, SpO2 99 %. Physical Exam  Wn wd healthy appearing woman in n ad.  A and O x 4.  Normal mood and affect.  EOMI.  Resp unlabored.  R knee with effusion and swelling.  Skin intact.  No active extension at the  knee.  R DP pulse palpable.  Active PF and DF at the teos.  Intact sens to LT at the dorsal and plantar forefoot. No lymphadenopathy.    L UE with swelling at the shoulder.  Pain with attempted ROM.  2+ radial pulse.  Intact motor and sensory fxn in the radial, ulnar and median n dist.  Assessment/Plan R displaced patella fracture - to the OR tonight for ORIF.  The risks and benefits of the alternative treatment options have been discussed in detail.  The patient wishes to proceed with surgery and specifically understands risks of bleeding, infection, nerve damage, blood clots, need for additional surgery, amputation and death.   L proximal humerus fracture- sling for comfort.  CT scan to eval fracture pattern.  Will review in AM with Dr. Rennis Chris for further treatment planning.  Admit post op for PT, OT and discharge planning.  Toni Arthurs, MD 10/21/2021, 5:04 PM

## 2021-10-21 NOTE — Anesthesia Procedure Notes (Signed)
Procedure Name: Intubation Date/Time: 10/21/2021 7:07 PM  Performed by: Cleda Daub, CRNAPre-anesthesia Checklist: Patient identified, Emergency Drugs available, Suction available and Patient being monitored Patient Re-evaluated:Patient Re-evaluated prior to induction Oxygen Delivery Method: Circle system utilized Preoxygenation: Pre-oxygenation with 100% oxygen Induction Type: IV induction Ventilation: Mask ventilation without difficulty Laryngoscope Size: Mac and 3 Grade View: Grade I Tube type: Oral Number of attempts: 1 Airway Equipment and Method: Stylet and Oral airway Placement Confirmation: ETT inserted through vocal cords under direct vision, positive ETCO2 and breath sounds checked- equal and bilateral Secured at: 21 cm Tube secured with: Tape Dental Injury: Teeth and Oropharynx as per pre-operative assessment

## 2021-10-21 NOTE — ED Triage Notes (Signed)
Pt arrived via POV, c/o mechanical fall today, c/o left shoulder and right knee pain.

## 2021-10-21 NOTE — Transfer of Care (Signed)
Immediate Anesthesia Transfer of Care Note  Patient: Diane Bauer  Procedure(s) Performed: OPEN REDUCTION INTERNAL FIXATION (ORIF) PATELLA (Right: Knee)  Patient Location: PACU  Anesthesia Type:General  Level of Consciousness: awake, alert , oriented and patient cooperative  Airway & Oxygen Therapy: Patient Spontanous Breathing and Patient connected to face mask oxygen  Post-op Assessment: Report given to RN and Post -op Vital signs reviewed and stable  Post vital signs: Reviewed and stable  Last Vitals:  Vitals Value Taken Time  BP 134/57 10/21/21 2040  Temp 36.8 C 10/21/21 2040  Pulse 79 10/21/21 2043  Resp 10 10/21/21 2043  SpO2 100 % 10/21/21 2043  Vitals shown include unvalidated device data.  Last Pain:  Vitals:   10/21/21 1815  TempSrc:   PainSc: 5          Complications: No notable events documented.

## 2021-10-21 NOTE — ED Provider Notes (Signed)
[facility]-EMERGENCY DEPT Provider Note   CSN: [id number] Arrival date & time: 10/21/21  1442     History  Chief Complaint  Patient presents with   [facility] Diane Bauer is a 81 y.o. female.  Pt is 81 yo female with a pmhx significant for htn, hypothyroidism, copd and osteopenia.  Pt said she tripped on a curb and fell.  She hurt her left shoulder and right knee.  She did bump her head, but not hard.  She is not on blood thinners.  No loc.       Home Medications Prior to Admission medications   Medication Sig Start Date End Date Taking? Authorizing Provider  amLODipine (NORVASC) 2.5 MG tablet Take 1 tablet (2.5 mg total) by mouth daily. 10/16/11   Schoenhoff, Harrington Challenger, MD  aspirin 81 MG tablet Take 81 mg by mouth daily.      [provider]  cholecalciferol (VITAMIN D) 1000 UNITS tablet Take 1,000 Units by mouth daily.      [provider]  COVID-19 mRNA bivalent vaccine, Pfizer, (PFIZER COVID-19 VAC BIVALENT) injection Inject into the muscle. 03/28/21   Judyann Munson, MD  fish oil-omega-3 fatty acids 1000 MG capsule Take 2 g by mouth daily.    [provider]  losartan-hydrochlorothiazide (HYZAAR) 100-25 MG per tablet Take 90 tablets by mouth daily. Patient taking differently: Take 1 tablet by mouth daily. 10/16/11   Schoenhoff, Harrington Challenger, MD  Multiple Vitamin (MULTIVITAMIN) tablet Take 1 tablet by mouth daily.      [provider]  thyroid (ARMOUR) 65 MG tablet Take one and 1/2 tablet daily Patient taking differently: Take 65 mg by mouth daily. 10/17/11   Schoenhoff, Harrington Challenger, MD  vitamin B-12 (CYANOCOBALAMIN) 50 MCG tablet Take 50 mcg by mouth daily.      [provider]      Allergies    Patient has no known allergies.    Review of Systems   Review of Systems  Musculoskeletal:        Left shoulder, right knee pain  All other systems reviewed and are negative.   Physical Exam Updated Vital  Signs BP (!) 132/58   Pulse 71   Temp 97.7 F (36.5 C) (Oral)   Resp 11   SpO2 96%  Physical Exam Vitals and nursing note reviewed.  Constitutional:      Appearance: Normal appearance.  HENT:     Head: Normocephalic and atraumatic.     Right Ear: External ear normal.     Left Ear: External ear normal.     Nose: Nose normal.     Mouth/Throat:     Mouth: Mucous membranes are moist.     Pharynx: Oropharynx is clear.  Eyes:     Extraocular Movements: Extraocular movements intact.     Conjunctiva/sclera: Conjunctivae normal.     Pupils: Pupils are equal, round, and reactive to light.  Cardiovascular:     Rate and Rhythm: Normal rate and regular rhythm.     Pulses: Normal pulses.     Heart sounds: Normal heart sounds.  Pulmonary:     Effort: Pulmonary effort is normal.     Breath sounds: Normal breath sounds.  Abdominal:     General: Abdomen is flat. Bowel sounds are normal.     Palpations: Abdomen is soft.  Musculoskeletal:     Left shoulder: Swelling and deformity present. Decreased range of motion.     Cervical back: Normal  range of motion and neck supple.     Right knee: Swelling and deformity present. Decreased range of motion.  Skin:    General: Skin is warm.     Capillary Refill: Capillary refill takes less than 2 seconds.  Neurological:     General: No focal deficit present.     Mental Status: She is alert and oriented to person, place, and time.  Psychiatric:        Mood and Affect: Mood normal.        Behavior: Behavior normal.     ED Results / Procedures / Treatments   Labs (all labs ordered are listed, but only abnormal results are displayed) Labs Reviewed  COMPREHENSIVE METABOLIC PANEL - Abnormal; Notable for the following components:      Result Value   Sodium 132 (*)    Glucose, Bld 109 (*)    All other components within normal limits  CBC WITH DIFFERENTIAL/PLATELET - Abnormal; Notable for the following components:   MCV 101.0 (*)    MCH 34.5 (*)     All other components within normal limits    EKG EKG Interpretation  Date/Time:  Friday October 21 2021 17:12:35 EDT Ventricular Rate:  71 PR Interval:  131 QRS Duration: 88 QT Interval:  397 QTC Calculation: 432 R Axis:   80 Text Interpretation: Sinus rhythm Probable LVH with secondary repol abnrm No old tracing to compare Confirmed by Jacalyn Lefevre [phone]) on 10/21/2021 5:52:35 PM  Radiology DG Chest 1 View  Result Date: 10/21/2021 CLINICAL DATA:  Mechanical fall EXAM: CHEST  1 VIEW COMPARISON:  None Available. FINDINGS: The cardiomediastinal silhouette is within normal limits. There is no focal airspace consolidation. There is no pleural effusion. No pneumothorax. Biapical pleuroparenchymal scarring. Probable calcified granuloma in the medial right lower lung. There is a comminuted displaced fracture of the proximal humerus at the surgical neck. IMPRESSION: Comminuted displaced fracture of the left proximal humerus at the surgical neck. No acute cardiopulmonary disease. Electronically Signed   By: Caprice Renshaw M.D.   On: 10/21/2021 16:21   DG Pelvis 1-2 Views  Result Date: 10/21/2021 CLINICAL DATA:  Mechanical fall EXAM: PELVIS - 1-2 VIEW COMPARISON:  None Available. FINDINGS: There is no evidence of acute fracture. Alignment is normal. 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 IMPRESSION: No evidence of acute fracture on single frontal view of the pelvis. Mild bilateral hip osteoarthritis. Electronically Signed   By: Caprice Renshaw M.D.   On: 10/21/2021 16:19   DG Knee Complete 4 Views Right  Result Date: 10/21/2021 CLINICAL DATA:  Pain and bruising after mechanical fall today. EXAM: RIGHT KNEE - COMPLETE 4+ VIEW COMPARISON:  None Available. FINDINGS: There is a displaced fracture of the mid patella with 1.8 cm distraction of fracture fragments. The fracture is minimally comminuted involving the inferior fracture fragment at the dominant fracture plane. Associated prepatellar soft  tissue edema and small joint effusion. No additional fracture of the knee. Normal tibiofemoral alignment. IMPRESSION: Displaced mid-patellar fracture with 1.8 cm distraction of fracture fragments. Electronically Signed   By: Narda Rutherford M.D.   On: 10/21/2021 16:17   DG Shoulder Left  Result Date: 10/21/2021 CLINICAL DATA:  Pain, mechanical fall today.  Left shoulder pain. EXAM: LEFT SHOULDER - 2+ VIEW COMPARISON:  None Available. FINDINGS: Comminuted and displaced fracture through the proximal humerus. Dominant fracture plane involves the surgical neck. There is medial displacement of a 3.5 cm butterfly fragment. Glenohumeral joint is congruent. Acromioclavicular joint is congruent. No  visualized fractures of the included ribs. IMPRESSION: Comminuted and displaced proximal humerus fracture involving the surgical neck. Electronically Signed   By: Narda Rutherford M.D.   On: 10/21/2021 16:15    Procedures Procedures    Medications Ordered in ED Medications  povidone-iodine 10 % swab 2 Application (has no administration in time range)  lactated ringers infusion (has no administration in time range)  ceFAZolin (ANCEF) IVPB 2g/100 mL premix (has no administration in time range)  tranexamic acid (CYKLOKAPRON) IVPB 1,000 mg (has no administration in time range)  morphine (PF) 4 MG/ML injection 4 mg (4 mg Intravenous Given 10/21/21 1515)  ondansetron (ZOFRAN) injection 4 mg (4 mg Intravenous Given 10/21/21 1514)  sodium chloride 0.9 % bolus 1,000 mL (1,000 mLs Intravenous New Bag/Given 10/21/21 1715)    ED Course/ Medical Decision Making/ A&P                           Medical Decision Making Amount and/or Complexity of Data Reviewed Labs: ordered. Radiology: ordered.  Risk Prescription drug management.   This patient presents to the ED for concern of multiple trauma, this involves an extensive number of treatment options, and is a complaint that carries with it a high risk of complications and  morbidity.  The differential diagnosis includes fx, contusion   Co morbidities that complicate the patient evaluation  htn, hypothyroidism, copd and osteopenia   Additional history obtained:  Additional history obtained from epic chart review External records from outside source obtained and reviewed including EMS report   Lab Tests:  I Ordered, and personally interpreted labs.  The pertinent results include:  cbc nl, cmp nl   Imaging Studies ordered:  I ordered imaging studies including cxr, pelvis, knee, shoulder  I independently visualized and interpreted imaging which showed  CXR: IMPRESSION:  Comminuted displaced fracture of the left proximal humerus at the  surgical neck.    No acute cardiopulmonary disease.  Pelvis: IMPRESSION:  No evidence of acute fracture on single frontal view of the pelvis.  Mild bilateral hip osteoarthritis.  Knee: IMPRESSION:  Displaced mid-patellar fracture with 1.8 cm distraction of fracture  fragments.  L shoulder: IMPRESSION:  Comminuted and displaced proximal humerus fracture involving the  surgical neck.  CT shoulder pending at time of OR I agree with the radiologist interpretation   Cardiac Monitoring:  The patient was maintained on a cardiac monitor.  I personally viewed and interpreted the cardiac monitored which showed an underlying rhythm of: nsr   Medicines ordered and prescription drug management:  I ordered medication including morphine  for pain  Reevaluation of the patient after these medicines showed that the patient improved I have reviewed the patients home medicines and have made adjustments as needed   Test Considered:  ct   Critical Interventions:  Pain control   Consultations Obtained:  I requested consultation with the orthopedist (Dr. Victorino Dike),  and discussed lab and imaging findings as well as pertinent plan - he said pt will need surgery on her knee and on her shoulder.  He can take her to  the OR tonight.  He requests a CT of her shoulder which was done.  Problem List / ED Course:  Left comminuted and displaced proximal humerus fx:  pt placed in a sling.  CT ordered Right mid-patellar fx:  pt unable to extend.  Dr. Victorino Dike will take to the OR.   Reevaluation:  After the interventions noted above, I reevaluated  the patient and found that they have :improved   Social Determinants of Health:  Lives alone, but has a son with whom she can stay   Dispostion:  After consideration of the diagnostic results and the patients response to treatment, I feel that the patent would benefit from admission.          Final Clinical Impression(s) / ED Diagnoses Final diagnoses:  Fall, initial encounter  Displaced comminuted fracture of right patella, initial encounter for closed fracture  Other closed displaced fracture of proximal end of left humerus, initial encounter    Rx / DC Orders ED Discharge Orders     None         Jacalyn Lefevre, MD 10/21/21 1753

## 2021-10-22 ENCOUNTER — Encounter (HOSPITAL_COMMUNITY)
Admission: EM | Disposition: A | Discharge status: Home / Self Care | Payer: Self-pay | Source: Home / Self Care | Attending: Orthopedic Surgery

## 2021-10-22 ENCOUNTER — Encounter (HOSPITAL_COMMUNITY): Payer: Self-pay | Admitting: Orthopedic Surgery

## 2021-10-22 ENCOUNTER — Observation Stay (HOSPITAL_COMMUNITY): Payer: Medicare HMO | Admitting: Anesthesiology

## 2021-10-22 ENCOUNTER — Observation Stay (HOSPITAL_COMMUNITY): Payer: Medicare HMO

## 2021-10-22 ENCOUNTER — Observation Stay (HOSPITAL_BASED_OUTPATIENT_CLINIC_OR_DEPARTMENT_OTHER): Payer: Medicare HMO | Admitting: Anesthesiology

## 2021-10-22 DIAGNOSIS — Z880 Allergy status to penicillin: Secondary | ICD-10-CM | POA: Diagnosis not present

## 2021-10-22 DIAGNOSIS — M160 Bilateral primary osteoarthritis of hip: Secondary | ICD-10-CM | POA: Diagnosis present

## 2021-10-22 DIAGNOSIS — J449 Chronic obstructive pulmonary disease, unspecified: Secondary | ICD-10-CM | POA: Diagnosis present

## 2021-10-22 DIAGNOSIS — S42292A Other displaced fracture of upper end of left humerus, initial encounter for closed fracture: Secondary | ICD-10-CM | POA: Diagnosis present

## 2021-10-22 DIAGNOSIS — M8580 Other specified disorders of bone density and structure, unspecified site: Secondary | ICD-10-CM | POA: Diagnosis present

## 2021-10-22 DIAGNOSIS — W101XXA Fall (on)(from) sidewalk curb, initial encounter: Secondary | ICD-10-CM | POA: Diagnosis present

## 2021-10-22 DIAGNOSIS — M62838 Other muscle spasm: Secondary | ICD-10-CM | POA: Diagnosis not present

## 2021-10-22 DIAGNOSIS — S42202A Unspecified fracture of upper end of left humerus, initial encounter for closed fracture: Secondary | ICD-10-CM

## 2021-10-22 DIAGNOSIS — I10 Essential (primary) hypertension: Secondary | ICD-10-CM | POA: Diagnosis present

## 2021-10-22 DIAGNOSIS — Z803 Family history of malignant neoplasm of breast: Secondary | ICD-10-CM | POA: Diagnosis not present

## 2021-10-22 DIAGNOSIS — E039 Hypothyroidism, unspecified: Secondary | ICD-10-CM | POA: Diagnosis present

## 2021-10-22 DIAGNOSIS — Z79899 Other long term (current) drug therapy: Secondary | ICD-10-CM | POA: Diagnosis not present

## 2021-10-22 DIAGNOSIS — S82041A Displaced comminuted fracture of right patella, initial encounter for closed fracture: Secondary | ICD-10-CM | POA: Diagnosis present

## 2021-10-22 DIAGNOSIS — Z7982 Long term (current) use of aspirin: Secondary | ICD-10-CM | POA: Diagnosis not present

## 2021-10-22 HISTORY — PX: ORIF HUMERUS FRACTURE: SHX2126

## 2021-10-22 LAB — TYPE AND SCREEN
ABO/RH(D): O NEG
Antibody Screen: NEGATIVE

## 2021-10-22 LAB — ABO/RH: ABO/RH(D): O NEG

## 2021-10-22 SURGERY — OPEN REDUCTION INTERNAL FIXATION (ORIF) PROXIMAL HUMERUS FRACTURE
Anesthesia: Regional | Site: Shoulder | Laterality: Left

## 2021-10-22 MED ORDER — LIDOCAINE 2% (20 MG/ML) 5 ML SYRINGE
INTRAMUSCULAR | Status: DC | PRN
  Administered 2021-10-22: 30 mg via INTRAVENOUS

## 2021-10-22 MED ORDER — ROCURONIUM BROMIDE 10 MG/ML (PF) SYRINGE
PREFILLED_SYRINGE | INTRAVENOUS | Status: AC
  Filled 2021-10-22: qty 10

## 2021-10-22 MED ORDER — HYDROMORPHONE HCL 1 MG/ML IJ SOLN: 0.2500 mg | INTRAMUSCULAR | Status: DC | PRN

## 2021-10-22 MED ORDER — PHENYLEPHRINE HCL (PRESSORS) 10 MG/ML IV SOLN
INTRAVENOUS | Status: AC
  Filled 2021-10-22: qty 1

## 2021-10-22 MED ORDER — POLYETHYLENE GLYCOL 3350 17 G PO PACK
17.0000 g | PACK | Freq: Every day | ORAL | Status: DC | PRN
Start: 2021-10-22 — End: 2021-10-24

## 2021-10-22 MED ORDER — ONDANSETRON HCL 4 MG/2ML IJ SOLN
INTRAMUSCULAR | Status: DC | PRN
  Administered 2021-10-22: 4 mg via INTRAVENOUS

## 2021-10-22 MED ORDER — ROCURONIUM BROMIDE 10 MG/ML (PF) SYRINGE
PREFILLED_SYRINGE | INTRAVENOUS | Status: DC | PRN
  Administered 2021-10-22: 50 mg via INTRAVENOUS
  Administered 2021-10-22: 20 mg via INTRAVENOUS

## 2021-10-22 MED ORDER — DEXAMETHASONE SODIUM PHOSPHATE 10 MG/ML IJ SOLN
INTRAMUSCULAR | Status: DC | PRN
  Administered 2021-10-22: 4 mg via INTRAVENOUS

## 2021-10-22 MED ORDER — LACTATED RINGERS IV SOLN: INTRAVENOUS | Status: DC

## 2021-10-22 MED ORDER — METOCLOPRAMIDE HCL 5 MG/ML IJ SOLN: 5.0000 mg | Freq: Three times a day (TID) | INTRAMUSCULAR | Status: DC | PRN

## 2021-10-22 MED ORDER — MENTHOL 3 MG MT LOZG
1.0000 | LOZENGE | OROMUCOSAL | Status: DC | PRN
  Filled 2021-10-22: qty 9

## 2021-10-22 MED ORDER — AMISULPRIDE (ANTIEMETIC) 5 MG/2ML IV SOLN: 10.0000 mg | Freq: Once | INTRAVENOUS | Status: DC | PRN

## 2021-10-22 MED ORDER — SUGAMMADEX SODIUM 200 MG/2ML IV SOLN
INTRAVENOUS | Status: DC | PRN
  Administered 2021-10-22: 150 mg via INTRAVENOUS

## 2021-10-22 MED ORDER — TRANEXAMIC ACID-NACL 1000-0.7 MG/100ML-% IV SOLN
1000.0000 mg | INTRAVENOUS | Status: AC
  Administered 2021-10-22: 1000 mg via INTRAVENOUS

## 2021-10-22 MED ORDER — OXYCODONE HCL 5 MG/5ML PO SOLN: 5.0000 mg | Freq: Once | ORAL | Status: DC | PRN

## 2021-10-22 MED ORDER — FENTANYL CITRATE (PF) 100 MCG/2ML IJ SOLN
INTRAMUSCULAR | Status: DC | PRN
Start: 2021-10-22 — End: 2021-10-22
  Administered 2021-10-22: 50 ug via INTRAVENOUS
  Administered 2021-10-22: 25 ug via INTRAVENOUS

## 2021-10-22 MED ORDER — MIDAZOLAM HCL 2 MG/2ML IJ SOLN
INTRAMUSCULAR | Status: AC
  Filled 2021-10-22: qty 2

## 2021-10-22 MED ORDER — CEFAZOLIN SODIUM-DEXTROSE 2-4 GM/100ML-% IV SOLN
INTRAVENOUS | Status: AC
  Filled 2021-10-22: qty 100

## 2021-10-22 MED ORDER — PROPOFOL 10 MG/ML IV BOLUS
INTRAVENOUS | Status: DC | PRN
  Administered 2021-10-22: 80 mg via INTRAVENOUS

## 2021-10-22 MED ORDER — BISACODYL 5 MG PO TBEC: 5.0000 mg | DELAYED_RELEASE_TABLET | Freq: Every day | ORAL | Status: DC | PRN

## 2021-10-22 MED ORDER — FENTANYL CITRATE (PF) 100 MCG/2ML IJ SOLN
INTRAMUSCULAR | Status: AC
  Filled 2021-10-22: qty 2

## 2021-10-22 MED ORDER — ONDANSETRON HCL 4 MG/2ML IJ SOLN: 4.0000 mg | Freq: Once | INTRAMUSCULAR | Status: DC | PRN

## 2021-10-22 MED ORDER — PHENYLEPHRINE HCL-NACL 20-0.9 MG/250ML-% IV SOLN
INTRAVENOUS | Status: DC | PRN
  Administered 2021-10-22: 10 ug/min via INTRAVENOUS

## 2021-10-22 MED ORDER — PHENYLEPHRINE HCL (PRESSORS) 10 MG/ML IV SOLN
INTRAVENOUS | Status: DC | PRN
  Administered 2021-10-22: 80 ug via INTRAVENOUS

## 2021-10-22 MED ORDER — PROPOFOL 500 MG/50ML IV EMUL
INTRAVENOUS | Status: DC | PRN
  Administered 2021-10-22: 125 ug/kg/min via INTRAVENOUS

## 2021-10-22 MED ORDER — DOCUSATE SODIUM 100 MG PO CAPS: 100.0000 mg | ORAL_CAPSULE | Freq: Two times a day (BID) | ORAL | Status: DC

## 2021-10-22 MED ORDER — OXYCODONE HCL 5 MG PO TABS: 5.0000 mg | ORAL_TABLET | Freq: Once | ORAL | Status: DC | PRN

## 2021-10-22 MED ORDER — PHENOL 1.4 % MT LIQD: 1.0000 | OROMUCOSAL | Status: DC | PRN

## 2021-10-22 MED ORDER — MAGNESIUM CITRATE PO SOLN: 1.0000 | Freq: Once | ORAL | Status: DC | PRN

## 2021-10-22 MED ORDER — ROPIVACAINE HCL 5 MG/ML IJ SOLN
INTRAMUSCULAR | Status: DC | PRN
  Administered 2021-10-22: 10 mL via EPIDURAL

## 2021-10-22 MED ORDER — ONDANSETRON HCL 4 MG/2ML IJ SOLN: 4.0000 mg | Freq: Four times a day (QID) | INTRAMUSCULAR | Status: DC | PRN

## 2021-10-22 MED ORDER — LACTATED RINGERS IV SOLN: INTRAVENOUS | Status: DC | PRN

## 2021-10-22 MED ORDER — BUPIVACAINE LIPOSOME 1.3 % IJ SUSP
INTRAMUSCULAR | Status: DC | PRN
  Administered 2021-10-22: 10 mL via PERINEURAL

## 2021-10-22 MED ORDER — METOCLOPRAMIDE HCL 5 MG PO TABS: 5.0000 mg | ORAL_TABLET | Freq: Three times a day (TID) | ORAL | Status: DC | PRN

## 2021-10-22 MED ORDER — MIDAZOLAM HCL 5 MG/5ML IJ SOLN
INTRAMUSCULAR | Status: DC | PRN
  Administered 2021-10-22: 1 mg via INTRAVENOUS

## 2021-10-22 MED ORDER — DIPHENHYDRAMINE HCL 12.5 MG/5ML PO ELIX: 12.5000 mg | ORAL_SOLUTION | ORAL | Status: DC | PRN

## 2021-10-22 MED ORDER — CEFAZOLIN SODIUM-DEXTROSE 2-4 GM/100ML-% IV SOLN
2.0000 g | INTRAVENOUS | Status: AC
  Administered 2021-10-22: 2 g via INTRAVENOUS

## 2021-10-22 MED ORDER — METHOCARBAMOL 500 MG PO TABS
500.0000 mg | ORAL_TABLET | Freq: Four times a day (QID) | ORAL | Status: DC | PRN
  Administered 2021-10-22 – 2021-10-24 (×5): 500 mg via ORAL
  Filled 2021-10-22 (×5): qty 1

## 2021-10-22 MED ORDER — THYROID 60 MG PO TABS
60.0000 mg | ORAL_TABLET | Freq: Every day | ORAL | Status: DC
  Filled 2021-10-22 (×2): qty 1

## 2021-10-22 MED ORDER — ONDANSETRON HCL 4 MG PO TABS: 4.0000 mg | ORAL_TABLET | Freq: Four times a day (QID) | ORAL | Status: DC | PRN

## 2021-10-22 MED ORDER — TRANEXAMIC ACID-NACL 1000-0.7 MG/100ML-% IV SOLN
INTRAVENOUS | Status: AC
  Filled 2021-10-22: qty 100

## 2021-10-22 MED ORDER — 0.9 % SODIUM CHLORIDE (POUR BTL) OPTIME
TOPICAL | Status: DC | PRN
  Administered 2021-10-22: 1000 mL

## 2021-10-22 MED ORDER — PROPOFOL 10 MG/ML IV BOLUS
INTRAVENOUS | Status: AC
  Filled 2021-10-22: qty 20

## 2021-10-22 SURGICAL SUPPLY — 66 items
BAG COUNTER SPONGE SURGICOUNT (BAG) IMPLANT
BAG ZIPLOCK 12X15 (MISCELLANEOUS) ×1 IMPLANT
BIT DRILL 3.2 (BIT) ×1
BIT DRILL 3.2XCALB NS DISP (BIT) IMPLANT
BIT DRILL CALIBRATED 2.7 (BIT) IMPLANT
BIT DRL 3.2XCALB NS DISP (BIT) ×1
COVER SURGICAL LIGHT HANDLE (MISCELLANEOUS) ×1 IMPLANT
DERMABOND ADVANCED (GAUZE/BANDAGES/DRESSINGS) ×1
DERMABOND ADVANCED .7 DNX12 (GAUZE/BANDAGES/DRESSINGS) ×1 IMPLANT
DRAPE C-ARM 42X120 X-RAY (DRAPES) ×1 IMPLANT
DRAPE C-ARMOR (DRAPES) IMPLANT
DRAPE ORTHO SPLIT 77X108 STRL (DRAPES) ×2
DRAPE SURG 17X11 SM STRL (DRAPES) ×1 IMPLANT
DRAPE SURG ORHT 6 SPLT 77X108 (DRAPES) ×2 IMPLANT
DRAPE U-SHAPE 47X51 STRL (DRAPES) ×1 IMPLANT
DRSG AQUACEL AG ADV 3.5X 6 (GAUZE/BANDAGES/DRESSINGS) IMPLANT
DRSG AQUACEL AG ADV 3.5X10 (GAUZE/BANDAGES/DRESSINGS) IMPLANT
DURAPREP 26ML APPLICATOR (WOUND CARE) ×1 IMPLANT
ELECT BLADE TIP CTD 4 INCH (ELECTRODE) ×1 IMPLANT
ELECT REM PT RETURN 15FT ADLT (MISCELLANEOUS) ×1 IMPLANT
GLOVE BIO SURGEON STRL SZ7.5 (GLOVE) ×1 IMPLANT
GLOVE BIO SURGEON STRL SZ8 (GLOVE) ×1 IMPLANT
GLOVE SS BIOGEL STRL SZ 7 (GLOVE) ×1 IMPLANT
GLOVE SS BIOGEL STRL SZ 7.5 (GLOVE) ×1 IMPLANT
GLOVE SUPERSENSE BIOGEL SZ 7 (GLOVE) ×1
GLOVE SUPERSENSE BIOGEL SZ 7.5 (GLOVE) ×1
GOWN STRL REUS W/ TWL LRG LVL3 (GOWN DISPOSABLE) ×2 IMPLANT
GOWN STRL REUS W/TWL LRG LVL3 (GOWN DISPOSABLE) ×2
K-WIRE 2X5 SS THRDED S3 (WIRE) ×1
KIT BASIN OR (CUSTOM PROCEDURE TRAY) ×1 IMPLANT
KIT TURNOVER KIT A (KITS) IMPLANT
KWIRE 2X5 SS THRDED S3 (WIRE) IMPLANT
MANIFOLD NEPTUNE II (INSTRUMENTS) ×1 IMPLANT
NDL TAPERED W/ NITINOL LOOP (MISCELLANEOUS) IMPLANT
NEEDLE TAPERED W/ NITINOL LOOP (MISCELLANEOUS) IMPLANT
NS IRRIG 1000ML POUR BTL (IV SOLUTION) ×1 IMPLANT
PACK SHOULDER (CUSTOM PROCEDURE TRAY) ×1 IMPLANT
PAD ARMBOARD 7.5X6 YLW CONV (MISCELLANEOUS) ×2 IMPLANT
PEG LOCKING 3.2MMX46 (Peg) IMPLANT
PEG LOCKING 3.2X 28MM (Peg) IMPLANT
PEG LOCKING 3.2X32 (Peg) IMPLANT
PEG LOCKING 3.2X36 (Screw) IMPLANT
PEG LOCKING 3.2X40 (Peg) IMPLANT
PEG LOCKING 3.2X48 (Peg) IMPLANT
PLATE PROX HUM HI L 3H 80 (Plate) IMPLANT
PROTECTOR NERVE ULNAR (MISCELLANEOUS) ×1 IMPLANT
PUTTY DBM STAGRAFT PLUS 10CC (Putty) IMPLANT
RESTRAINT HEAD UNIVERSAL NS (MISCELLANEOUS) ×1 IMPLANT
SCREW LOW PROF TIS 3.5X28MM (Screw) IMPLANT
SCREW LP NL T15 3.5X24 (Screw) IMPLANT
SCREW LP NL T15 3.5X26 (Screw) IMPLANT
SCREW PEG LOCK 3.2X30MM (Screw) IMPLANT
SLEEVE MEASURING 3.2 (BIT) IMPLANT
SLING ARM FOAM STRAP LRG (SOFTGOODS) IMPLANT
SLING ARM FOAM STRAP MED (SOFTGOODS) IMPLANT
SLING ARM FOAM STRAP SML (SOFTGOODS) IMPLANT
SUCTION FRAZIER HANDLE 12FR (TUBING) ×1
SUCTION TUBE FRAZIER 12FR DISP (TUBING) ×1 IMPLANT
SUT FIBERWIRE #2 38 T-5 BLUE (SUTURE)
SUT MNCRL AB 3-0 PS2 18 (SUTURE) ×1 IMPLANT
SUT MON AB 2-0 CT1 36 (SUTURE) ×1 IMPLANT
SUT VIC AB 1 CT1 36 (SUTURE) ×1 IMPLANT
SUTURE FIBERWR #2 38 T-5 BLUE (SUTURE) IMPLANT
TOWEL OR 17X26 10 PK STRL BLUE (TOWEL DISPOSABLE) ×1 IMPLANT
TOWEL OR NON WOVEN STRL DISP B (DISPOSABLE) ×1 IMPLANT
YANKAUER SUCT BULB TIP 10FT TU (MISCELLANEOUS) ×1 IMPLANT

## 2021-10-22 NOTE — Progress Notes (Signed)
OT Cancellation Note  Patient Details Name: Diane Bauer MRN: [id number] DOB: 1941/01/31   Cancelled Treatment:    Reason Eval/Treat Not Completed: Other (comment) OT to hold until after Ortho consult on L shoulder this AM. Nurse in agreement. OT to continue to follow and check back as schedule will allow.  Rosalio Loud, MS Acute Rehabilitation Department Office# [phone]  10/22/2021, 8:53 AM

## 2021-10-22 NOTE — Anesthesia Preprocedure Evaluation (Addendum)
Anesthesia Evaluation  Patient identified by MRN, date of birth, ID band Patient awake    Reviewed: Allergy & Precautions, NPO status , Patient's Chart, lab work & pertinent test results  Airway Mallampati: II  TM Distance: >3 FB Neck ROM: Full    Dental  (+) Dental Advisory Given, Teeth Intact, Caps   Pulmonary  Episode of bronchitis a few years ago, no issues since then    Pulmonary exam normal breath sounds clear to auscultation       Cardiovascular hypertension (134/56 in preop), Pt. on medications Normal cardiovascular exam Rhythm:Regular Rate:Normal     Neuro/Psych negative neurological ROS  negative psych ROS   GI/Hepatic negative GI ROS, Neg liver ROS,   Endo/Other  Hypothyroidism   Renal/GU negative Renal ROS  negative genitourinary   Musculoskeletal negative musculoskeletal ROS (+)   Abdominal   Peds  Hematology negative hematology ROS (+) Hb 13.5, plt 203   Anesthesia Other Findings   Reproductive/Obstetrics negative OB ROS                            Anesthesia Physical Anesthesia Plan  ASA: 2  Anesthesia Plan: General and Regional   Post-op Pain Management: Regional block* and Ofirmev IV (intra-op)*   Induction: Intravenous  PONV Risk Score and Plan: 3 and Ondansetron, Dexamethasone, Treatment may vary due to age or medical condition, Propofol infusion and TIVA  Airway Management Planned: Oral ETT  Additional Equipment: None  Intra-op Plan:   Post-operative Plan: Extubation in OR  Informed Consent: I have reviewed the patients History and Physical, chart, labs and discussed the procedure including the risks, benefits and alternatives for the proposed anesthesia with the patient or authorized representative who has indicated his/her understanding and acceptance.     Dental advisory given  Plan Discussed with: CRNA  Anesthesia Plan Comments: (ORIF R patella  last night under GA- PONV in PACU, will plan on TIVA for this anesthetic  Ventilation: Mask ventilation without difficulty Laryngoscope Size: Mac and 3 Grade View: Grade I Tube type: Oral Number of attempts: 1)      Anesthesia Quick Evaluation

## 2021-10-22 NOTE — Discharge Instructions (Signed)
Diane Bauer. Supple, M.D., F.A.A.O.S. [facility] Specializing in Arthroscopic and Reconstructive Surgery of the Shoulder [phone] [address] [place], [place] [address] - Fax [phone]   POST-OP  SHOULDER  INSTRUCTIONS  1. Follow up in the office for your first post-op appointment 10-14 days from the date of your surgery. If you do not already have a scheduled appointment, our office will contact you to schedule.  2. The bandage over your incision is waterproof. You may begin showering with this dressing on. You may leave this dressing on until first follow up appointment within 2 weeks. We prefer you leave this dressing in place until follow up however after 5-7 days if you are having itching or skin irritation and would like to remove it you may do so. Go slow and tug at the borders gently to break the bond the dressing has with the skin. At this point if there is no drainage it is okay to go without a bandage or you may cover it with a light guaze and tape. You can also expect significant bruising around your shoulder that will drift down your arm and into your chest wall. This is very normal and should resolve over several days.   3. Wear your sling/immobilizer at all times except to perform the exercises below or to occasionally let your arm dangle by your side to stretch your elbow. You also need to sleep in your sling immobilizer until instructed otherwise. It is ok to remove your sling if you are sitting in a controlled environment and allow your arm to rest in a position of comfort by your side or on your lap with pillows to give your neck and skin a break from the sling. You may remove it to allow arm to dangle by side to shower. If you are up walking around and when you go to sleep at night you need to wear it.  4. Range of motion to your elbow, wrist, and hand are encouraged 3-5 times daily. Exercise to your hand and fingers helps to reduce swelling you may  experience.   5. Prescriptions for a pain medication and a muscle relaxant are provided for you. It is recommended that if you are experiencing pain that you pain medication alone is not controlling, add the muscle relaxant along with the pain medication which can give additional pain relief. The first 1-2 days is generally the most severe of your pain and then should gradually decrease. As your pain lessens it is recommended that you decrease your use of the pain medications to an "as needed basis'" only and to always comply with the recommended dosages of the pain medications.  6. Pain medications can produce constipation along with their use. If you experience this, the use of an over the counter stool softener or laxative daily is recommended.   7. For additional questions or concerns, please do not hesitate to call the office. If after hours there is an answering service to forward your concerns to the physician on call.  8.Pain control following an exparel block  To help control your post-operative pain you received a nerve block  performed with Exparel which is a long acting anesthetic (numbing agent) which can provide pain relief and sensations of numbness (and relief of pain) in the operative shoulder and arm for up to 3 days. Sometimes it provides mixed relief, meaning you may still have numbness in certain areas of the arm but can still be able to  move  parts of that arm, hand, and fingers. We recommend that your prescribed pain medications  be used as needed. We do not feel it is necessary to "pre medicate" and "stay ahead" of pain.  Taking narcotic pain medications when you are not having any pain can lead to unnecessary and potentially dangerous side effects.      POST-OP EXERCISES  OK to allow left arm to dangle for hygiene and move elbow wrist and hand

## 2021-10-22 NOTE — Op Note (Signed)
10/21/2021 - 10/22/2021  1:52 PM  PATIENT:   Diane Bauer  81 y.o. female  PRE-OPERATIVE DIAGNOSIS: Comminuted, displaced left 2 part proximal humerus fracture  POST-OPERATIVE DIAGNOSIS: Same  PROCEDURE: Open reduction and internal fixation of comminuted and displaced left 2 part proximal humerus fracture with the additional application of allograft bone graft to the fracture site  SURGEON:  Shavonta Gossen, Vania Rea M.D.  ASSISTANTS: Ralene Bathe, PA-C  ANESTHESIA:   General endotracheal and interscalene block with Exparel  EBL: 200 cc  SPECIMEN: None  Drains: None   PATIENT DISPOSITION:  PACU - hemodynamically stable.    PLAN OF CARE: Admit to inpatient   Brief history:  Patient is a 81 year old female who experienced a ground-level fall yesterday sustaining a displaced right patella fracture as well as comminuted and severely displaced and unstable left proximal humerus fracture.  She was taken to surgery yesterday evening where she had an open reduction and internal fixation of patella fracture.  We were asked to evaluate her left shoulder.  Plain radiographs demonstrate a highly comminuted and displaced and unstable proximal humerus fracture.  CT scan confirms the above.  Due to the degree of displacement and instability we have discussed with the patient the various treatment options and potential risks versus benefits thereof.  Possible surgical complications were reviewed including the potential for bleeding, infection, neurovascular injury, persistence of pain, loss of motion, anesthetic complication, malunion, nonunion, loss of fixation, and possible need for additional surgery.  She understands, and accepts, and agrees with the plan procedure.  Procedure in detail:  After undergoing routine preop evaluation the patient received prophylactic antibiotics and interscalene block with Exparel was established in the holding area by the anesthesia department.  Subsequently  placed supine on the operating table and underwent the smooth induction of a general endotracheal anesthesia.  Placed into the beachchair position and appropriately padded and protected.  Fluoroscopic imaging was then brought in to confirm that visualization could be properly achieved.  The left shoulder girdle region was then sterilely prepped and draped in standard fashion.  Timeout was called.  A deltopectoral approach left shoulder through an approximately 8 cm incision was made with dissection carried down through the skin and subcutaneous tissues and electrocautery was then used for hemostasis.  The deltopectoral interval was then developed from proximal to this with the vein taken laterally.  Dissection beneath the deltoid allowed [place] to identify the fracture site which had several large fracture fragments with marked instability and displacement.  We dissected along the anterolateral margin of the humerus to allow for the placement of our Biomet proximal humeral plate.  The plate was then provisionally placed over the anterolateral aspect of the proximal humerus using the long head biceps tendon as a landmark to coordinate rotational alignment between the head and shaft segments.  Due to the high degree of instability it was not possible to obtain provisional fixation and so we initially gained alignment of the proximal aspect of the plate up into the humeral head and confirmed on fluoroscopic imaging that the overall position and alignment were appropriate after guidepin was directed up into the center of the head.  Then placed a series of pegs up of the humeral head to gain control of the humeral head segment.  We then reduce this back to the humeral shaft and used bone clamps to regain the length and once we were satisfied with the overall alignment using fluoroscopic imaging we transfixed the plate to the shaft using  the appropriate length screws.  Once we confirmed good alignment and relationships with  the proximal and distal segments and overall bony alignment and position of the hardware we completed fixation with the balance of the screws into the humeral head and into the shaft.  Final imaging showed all hardware to be in good position with good alignment at the fracture site and appropriate restoration of length.  At this point given the significant bony defect at the fracture site we utilized 10 cc of stay graft bone graft putty to fill the metaphyseal void.  Final hemostasis was obtained.  The wound was copious irrigated.  The deltopectoral interval was reapproximated with a series of figure-of-eight #1 Vicryl sutures.  2-0 Monocryl used to close the subcu layer and intracuticular 3-0 Monocryl used to close the skin followed by Dermabond and Aquacel dressing.  The left arm was then placed into a sling and the patient was awakened, extubated, and taken to the recovery room in stable condition.  Ralene Bathe, PA-C was utilized as an Geophysicist/field seismologist throughout this case, essential for help with positioning the patient, positioning extremity, tissue manipulation, implantation of the prosthesis, suture management, wound closure, and intraoperative decision-making.  Vania Rea Chavela Justiniano MD  Postop plan is for strict nonweightbearing to the left upper extremity.  Continue with sling immobilization.  She may come out of the sling 2 or 3 times a day to let her arm dangle at her side.  Gentle motion of the elbow, wrist, and hand.  No active motion of left shoulder.  She may perform gentle pendulum exercises and position of the left arm on a pillow for comfort.   Contact # [phone]

## 2021-10-22 NOTE — Consult Note (Addendum)
Reason for Consult: Left proximal humerus fracture Referring Physician: Toni Arthurs, MD  HPI: Diane Bauer is an 81 y.o. female who is very active and unfortunately injured herself yesterday when [place] and went to walk up an incline and unfortunately slipped and fell hard on some concrete sustaining a displaced patella fracture as well as a left proximal humerus fracture.  She underwent surgical fixation of her displaced right patella fracture yesterday and we were consulted now to assume care for her left shoulder injury.  Prior to these injuries which she was very active.  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  She lives independently in a apartment across from her son in a local community that does have elevator access.  She is right-hand dominant.  She also reports that she is vegan.  Past Medical History:  Diagnosis Date   Bronchospasm    occassional   COPD (chronic obstructive pulmonary disease) (HCC)    by CXR   Hypertension    Hypothyroidism    Osteopenia     Past Surgical History:  Procedure Laterality Date   DILATION AND CURETTAGE OF UTERUS     x2, secondary to menorrhagia    Family History  Problem Relation Age of Onset   Breast cancer Sister    Breast cancer Paternal Aunt     Social History:  reports that she has never smoked. She has never used smokeless tobacco. She reports that she does not drink alcohol and does not use drugs.  Allergies:  Allergies  Allergen Reactions   Amoxicillin-Pot Clavulanate Diarrhea    Medications: I have reviewed the patient's current medications.  Results for orders placed or performed during the hospital encounter of 10/21/21 (from the past 48 hour(s))  Comprehensive metabolic panel     Status: Abnormal   Collection Time: 10/21/21  5:02 PM  Result Value Ref Range   Sodium 132 (L) 135 - 145 mmol/L   Potassium 3.5 3.5 - 5.1 mmol/L    Comment: HEMOLYSIS AT THIS LEVEL MAY  AFFECT RESULT   Chloride 99 98 - 111 mmol/L   CO2 24 22 - 32 mmol/L   Glucose, Bld 109 (H) 70 - 99 mg/dL    Comment: Glucose reference range applies only to samples taken after fasting for at least 8 hours.   BUN 20 8 - 23 mg/dL   Creatinine, Ser 3.57 0.44 - 1.00 mg/dL   Calcium 9.1 8.9 - 01.7 mg/dL   Total Protein 6.7 6.5 - 8.1 g/dL   Albumin 3.7 3.5 - 5.0 g/dL   AST 31 15 - 41 U/L    Comment: HEMOLYSIS AT THIS LEVEL MAY AFFECT RESULT   ALT 12 0 - 44 U/L    Comment: HEMOLYSIS AT THIS LEVEL MAY AFFECT RESULT   Alkaline Phosphatase 76 38 - 126 U/L   Total Bilirubin 1.1 0.3 - 1.2 mg/dL    Comment: HEMOLYSIS AT THIS LEVEL MAY AFFECT RESULT   GFR, Estimated >60 >60 mL/min    Comment: (NOTE) Calculated using the CKD-EPI Creatinine Equation (2021)    Anion gap 9 5 - 15    Comment: Performed at [facility], [address] [place], [place] [address]  CBC with Differential     Status: Abnormal   Collection Time: 10/21/21  5:02 PM  Result Value Ref Range   WBC 6.4 4.0 - 10.5 K/uL   RBC 3.91 3.87 - 5.11 MIL/uL   Hemoglobin 13.5  12.0 - 15.0 g/dL   HCT 00.9 38.1 - 82.9 %   MCV 101.0 (H) 80.0 - 100.0 fL   MCH 34.5 (H) 26.0 - 34.0 pg   MCHC 34.2 30.0 - 36.0 g/dL   RDW 93.7 16.9 - 67.8 %   Platelets 203 150 - 400 K/uL   nRBC 0.0 0.0 - 0.2 %   Neutrophils Relative % 58 %   Neutro Abs 3.7 1.7 - 7.7 K/uL   Lymphocytes Relative 29 %   Lymphs Abs 1.8 0.7 - 4.0 K/uL   Monocytes Relative 11 %   Monocytes Absolute 0.7 0.1 - 1.0 K/uL   Eosinophils Relative 1 %   Eosinophils Absolute 0.1 0.0 - 0.5 K/uL   Basophils Relative 1 %   Basophils Absolute 0.0 0.0 - 0.1 K/uL   Immature Granulocytes 0 %   Abs Immature Granulocytes 0.01 0.00 - 0.07 K/uL    Comment: Performed at [facility], [address], [place], [place] [address]    DG Knee 1-2 Views Right  Result Date: 10/21/2021 CLINICAL DATA:  Right patella surgery. EXAM: RIGHT KNEE - 1-2 VIEW COMPARISON:   10/21/2021. FINDINGS: Two fluoroscopic images were obtained intraoperatively. Total fluoroscopy time is 27 seconds. Dose: 1.37 mGy. Images demonstrate open reduction internal fixation of patellar fracture with improved alignment. Please see operative report for additional information. IMPRESSION: Intraoperative utilization of fluoroscopy. Electronically Signed   By: Thornell Sartorius M.D.   On: 10/21/2021 20:32   DG C-Arm 1-60 Min-No Report  Result Date: 10/21/2021 Fluoroscopy was utilized by the requesting physician.  No radiographic interpretation.   DG C-Arm 1-60 Min-No Report  Result Date: 10/21/2021 Fluoroscopy was utilized by the requesting physician.  No radiographic interpretation.   CT Shoulder Left Wo Contrast  Result Date: 10/21/2021 CLINICAL DATA:  Shoulder fracture, trauma. EXAM: CT OF THE UPPER LEFT EXTREMITY WITHOUT CONTRAST TECHNIQUE: Multidetector CT imaging of the upper left extremity was performed according to the standard protocol. RADIATION DOSE REDUCTION: This exam was performed according to the departmental dose-optimization program which includes automated exposure control, adjustment of the mA and/or kV according to patient size and/or use of iterative reconstruction technique. COMPARISON:  Radiograph earlier today FINDINGS: Bones/Joint/Cartilage Comminuted and displaced fracture involving proximal humerus. The dominant fracture plane involves the surgical neck. A posterior fracture component involves the posterior humeral head cortex, including the rotator cuff insertion. There is medial displacement of a 3.5 cm fracture fragment from the proximal metaphysis. Fracture approaches but does not involve the glenohumeral joint. There is likely involvement of the lower aspect of the bicipital groove. No acute fracture of the scapula, clavicle or acromion. There is a small shoulder lipohemarthrosis. Ligaments Suboptimally assessed by CT. Muscles and Tendons There is no rotator cuff muscle  atrophy. Soft tissues Mild generalized soft tissue edema. No fracture of the included ribs. IMPRESSION: Comminuted and displaced fracture involving the proximal humerus. The dominant fracture plane involves the surgical neck. A posterior fracture component involves the posterior humeral head cortex. Fracture approaches but does not involve the glenohumeral joint. Electronically Signed   By: Narda Rutherford M.D.   On: 10/21/2021 18:36   DG Chest 1 View  Result Date: 10/21/2021 CLINICAL DATA:  Mechanical fall EXAM: CHEST  1 VIEW COMPARISON:  None Available. FINDINGS: The cardiomediastinal silhouette is within normal limits. There is no focal airspace consolidation. There is no pleural effusion. No pneumothorax. Biapical pleuroparenchymal scarring. Probable calcified granuloma in the medial right lower lung. There is a comminuted  displaced fracture of the proximal humerus at the surgical neck. IMPRESSION: Comminuted displaced fracture of the left proximal humerus at the surgical neck. No acute cardiopulmonary disease. Electronically Signed   By: Caprice Renshaw M.D.   On: 10/21/2021 16:21   DG Pelvis 1-2 Views  Result Date: 10/21/2021 CLINICAL DATA:  Mechanical fall EXAM: PELVIS - 1-2 VIEW COMPARISON:  None Available. FINDINGS: There is no evidence of acute fracture. Alignment is normal. 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 IMPRESSION: No evidence of acute fracture on single frontal view of the pelvis. Mild bilateral hip osteoarthritis. Electronically Signed   By: Caprice Renshaw M.D.   On: 10/21/2021 16:19   DG Knee Complete 4 Views Right  Result Date: 10/21/2021 CLINICAL DATA:  Pain and bruising after mechanical fall today. EXAM: RIGHT KNEE - COMPLETE 4+ VIEW COMPARISON:  None Available. FINDINGS: There is a displaced fracture of the mid patella with 1.8 cm distraction of fracture fragments. The fracture is minimally comminuted involving the inferior fracture fragment at the dominant fracture plane.  Associated prepatellar soft tissue edema and small joint effusion. No additional fracture of the knee. Normal tibiofemoral alignment. IMPRESSION: Displaced mid-patellar fracture with 1.8 cm distraction of fracture fragments. Electronically Signed   By: Narda Rutherford M.D.   On: 10/21/2021 16:17   DG Shoulder Left  Result Date: 10/21/2021 CLINICAL DATA:  Pain, mechanical fall today.  Left shoulder pain. EXAM: LEFT SHOULDER - 2+ VIEW COMPARISON:  None Available. FINDINGS: Comminuted and displaced fracture through the proximal humerus. Dominant fracture plane involves the surgical neck. There is medial displacement of a 3.5 cm butterfly fragment. Glenohumeral joint is congruent. Acromioclavicular joint is congruent. No visualized fractures of the included ribs. IMPRESSION: Comminuted and displaced proximal humerus fracture involving the surgical neck. Electronically Signed   By: Narda Rutherford M.D.   On: 10/21/2021 16:15    ROS: Per previous history and physical  Physical Exam:  Her left upper extremity is currently in a sling.  She is neurovascular intact to digital range of motion good pulses.  Moderate swelling is noted about the shoulder with some early bruising as expected. Vitals Temp:  [97.4 F (36.3 C)-98.4 F (36.9 C)] 98.4 F (36.9 C) (09/02 0452) Pulse Rate:  [70-88] 78 (09/02 0452) Resp:  [9-20] 16 (09/02 0452) BP: (113-149)/(49-64) 129/61 (09/02 0452) SpO2:  [82 %-100 %] 97 % (09/02 0452) Weight:  [50.3 kg-54.6 kg] 54.6 kg (09/02 0128) Body mass index is 19.43 kg/m.  Assessment/Plan: Impression:1. Left displaced proximal humerus fracture three-part 2.  Status post open reduction internal fixation of right displaced patella fracture  Treatment: We have reviewed her radiographs today as well as surgical options and given her activity levels and the amount of displacement we would recommend open reduction internal fixation of the left proximal humerus.  We discussed the surgery  and the postoperative protocol.  She understands and wishes to proceed.  We will go ahead and make arrangements as she has been n.p.o. this morning to proceed with surgical management today.  All questions were encouraged and answered and we will follow her along clinically.  Her plan is to discharge home under the care of her son.  French Ana Shuford for Dr. Francena Hanly 10/22/2021, 9:24 AM  Contact # [phone]  Patient seen and evaluated.  Treatment options reviewed.  Agree with above consultation.  Patient to surgery this afternoon.  Senaida Lange MD

## 2021-10-22 NOTE — Transfer of Care (Signed)
Immediate Anesthesia Transfer of Care Note  Patient: Diane Bauer  Procedure(s) Performed: Procedure(s): OPEN REDUCTION INTERNAL FIXATION (ORIF) PROXIMAL HUMERUS FRACTURE (Left)  Patient Location: PACU  Anesthesia Type:General  Level of Consciousness:  sedated, patient cooperative and responds to stimulation  Airway & Oxygen Therapy:Patient Spontanous Breathing and Patient connected to face mask oxgen  Post-op Assessment:  Report given to PACU RN and Post -op Vital signs reviewed and stable  Post vital signs:  Reviewed and stable  Last Vitals:  Vitals:   10/22/21 1107 10/22/21 1412  BP: (!) 134/56 (!) 135/53  Pulse: 79 63  Resp: 14 (!) 8  Temp: 37.2 C (!) 36.2 C  SpO2: 92% 100%    Complications: No apparent anesthesia complications

## 2021-10-22 NOTE — Anesthesia Procedure Notes (Signed)
Anesthesia Regional Block: Interscalene brachial plexus block   Pre-Anesthetic Checklist: , timeout performed,  Correct Patient, Correct Site, Correct Laterality,  Correct Procedure, Correct Position, site marked,  Risks and benefits discussed,  Surgical consent,  Pre-op evaluation,  At surgeon's request and post-op pain management  Laterality: Left  Prep: Maximum Sterile Barrier Precautions used, chloraprep       Needles:  Injection technique: Single-shot  Needle Type: Echogenic Stimulator Needle     Needle Length: 9cm  Needle Gauge: 22     Additional Needles:   Procedures:,,,, ultrasound used (permanent image in chart),,    Narrative:  Start time: 10/22/2021 11:35 AM End time: 10/22/2021 11:40 AM Injection made incrementally with aspirations every 5 mL.  Performed by: Personally  Anesthesiologist: Lannie Fields, DO  Additional Notes: Monitors applied. No increased pain on injection. No increased resistance to injection. Injection made in 5cc increments. Good needle visualization. Patient tolerated procedure well.

## 2021-10-22 NOTE — Anesthesia Procedure Notes (Signed)
Procedure Name: Intubation Date/Time: 10/22/2021 12:33 PM  Performed by: Lavina Hamman, CRNAPre-anesthesia Checklist: Patient identified, Emergency Drugs available, Suction available, Patient being monitored and Timeout performed Patient Re-evaluated:Patient Re-evaluated prior to induction Oxygen Delivery Method: Circle system utilized Preoxygenation: Pre-oxygenation with 100% oxygen Induction Type: IV induction Ventilation: Mask ventilation without difficulty Laryngoscope Size: Mac and 4 Grade View: Grade II Tube type: Oral Tube size: 7.5 mm Number of attempts: 1 Airway Equipment and Method: Stylet Placement Confirmation: ETT inserted through vocal cords under direct vision, positive ETCO2, CO2 detector and breath sounds checked- equal and bilateral Secured at: 22 cm Tube secured with: Tape Dental Injury: Teeth and Oropharynx as per pre-operative assessment  Comments: ATOI

## 2021-10-22 NOTE — Plan of Care (Signed)
  Problem: Clinical Measurements: Goal: Diagnostic test results will improve Outcome: Progressing   Problem: Clinical Measurements: Goal: Respiratory complications will improve Outcome: Progressing   Problem: Clinical Measurements: Goal: Cardiovascular complication will be avoided Outcome: Progressing   Problem: Coping: Goal: Level of anxiety will decrease Outcome: Progressing   Problem: Elimination: Goal: Will not experience complications related to bowel motility Outcome: Progressing   

## 2021-10-22 NOTE — Anesthesia Postprocedure Evaluation (Signed)
Anesthesia Post Note  Patient: Interior and spatial designer  Procedure(s) Performed: OPEN REDUCTION INTERNAL FIXATION (ORIF) PROXIMAL HUMERUS FRACTURE (Left: Shoulder)     Patient location during evaluation: PACU Anesthesia Type: Regional and General Level of consciousness: awake and alert, oriented and patient cooperative Pain management: pain level controlled Vital Signs Assessment: post-procedure vital signs reviewed and stable Respiratory status: spontaneous breathing, nonlabored ventilation and respiratory function stable Cardiovascular status: blood pressure returned to baseline and stable Postop Assessment: no apparent nausea or vomiting Anesthetic complications: no   No notable events documented.  Last Vitals:  Vitals:   10/22/21 1418 10/22/21 1430  BP:  133/60  Pulse: 64 62  Resp: 14 12  Temp:    SpO2: 100% 96%    Last Pain:  Vitals:   10/22/21 1415  TempSrc:   PainSc: Asleep                 Lannie Fields

## 2021-10-22 NOTE — Progress Notes (Signed)
Pt down to pacu in stable condition. No needs at time of transfer.  °

## 2021-10-22 NOTE — Progress Notes (Signed)
Pt stable on return from pacu. No needs at time of arrival to floor. VS and assessment performed.

## 2021-10-22 NOTE — Progress Notes (Signed)
PT Cancellation Note  Patient Details Name: Diane Bauer MRN: [id number] DOB: 1940-07-24   Cancelled Treatment:    Reason Eval/Treat Not Completed: Patient not medically ready  PT to hold until after Ortho consult on L shoulder this AM. Nurse in agreement.  PT to continue to follow and check back as schedule allows or as per ortho orders.   Delice Bison, PT  Acute Rehab Dept [facility]) [phone]  [facility] Weekend Pager [facility] only)  [phone]  10/22/2021  [facility]  10/22/2021, 9:04 AM

## 2021-10-23 MED ORDER — ASPIRIN 81 MG PO CHEW
81.0000 mg | CHEWABLE_TABLET | Freq: Two times a day (BID) | ORAL | Status: DC
  Administered 2021-10-23 – 2021-10-24 (×2): 81 mg via ORAL
  Filled 2021-10-23 (×2): qty 1

## 2021-10-23 MED ORDER — LOSARTAN POTASSIUM 50 MG PO TABS
50.0000 mg | ORAL_TABLET | Freq: Two times a day (BID) | ORAL | Status: DC
  Administered 2021-10-23 – 2021-10-24 (×2): 50 mg via ORAL
  Filled 2021-10-23 (×2): qty 1

## 2021-10-23 MED ORDER — AMLODIPINE BESYLATE 5 MG PO TABS
2.5000 mg | ORAL_TABLET | Freq: Two times a day (BID) | ORAL | Status: DC
  Administered 2021-10-23 – 2021-10-24 (×2): 2.5 mg via ORAL
  Filled 2021-10-23 (×2): qty 1

## 2021-10-23 NOTE — Evaluation (Signed)
Physical Therapy Evaluation Patient Details Name: Diane Bauer MRN: [id number] DOB: 08/01/1940 Today's Date: 10/23/2021  History of Present Illness  81 y.o. female who is very active and unfortunately injured herself yesterday when she was out shopping and on  concrete sustaining a displaced L patella fracture as well as a left proximal humerus fracture.  She underwent ORIF displaced R patella fracture on 10/21/21 per Dr Victorino Dike  and s/p ORIF L humerus fx per Dr Rennis Chris on 10/22/21. PMH: HTN  Clinical Impression  Pt admitted with above diagnosis.   Pt is doing very well, amb with hemiwalker and progressed to [facility]. Anticipate steady progress in acute setting, feel pt will need another day to work with PT prior to d/c home with son's assist.   Will likely be ready for d/c Monday from PT standpoint.   Pt currently with functional limitations due to the deficits listed below (see PT Problem List). Pt will benefit from skilled PT to increase their independence and safety with mobility to allow discharge to the venue listed below.          Recommendations for follow up therapy are one component of a multi-disciplinary discharge planning process, led by the attending physician.  Recommendations may be updated based on patient status, additional functional criteria and insurance authorization.  Follow Up Recommendations Home health PT      Assistance Recommended at Discharge Intermittent Supervision/Assistance  Patient can return home with the following  Help with stairs or ramp for entrance;A little help with walking and/or transfers;Assistance with cooking/housework;Assist for transportation;A little help with bathing/dressing/bathroom    Equipment Recommendations None recommended by PT (has [facility])  Recommendations for Other Services       Functional Status Assessment Patient has had a recent decline in their functional status and demonstrates the ability to make significant improvements in  function in a reasonable and predictable amount of time.     Precautions / Restrictions Precautions Precautions: Shoulder Type of Shoulder Precautions: 2-3 x a day can come out of sling to dangle, genlte ROM of elbow, wrist and hand. gentle pendulums NO AROM/AAROM of shoulder.NWB LUE Shoulder Interventions: Shoulder sling/immobilizer;At all times;Off for dressing/bathing/exercises Required Braces or Orthoses: Knee Immobilizer - Right Knee Immobilizer - Right: On at all times Restrictions Weight Bearing Restrictions: Yes LUE Weight Bearing: Non weight bearing RLE Weight Bearing: Weight bearing as tolerated      Mobility  Bed Mobility               General bed mobility comments: in recliner    Transfers Overall transfer level: Needs assistance Equipment used: None Transfers: Sit to/from Stand Sit to Stand: Min guard           General transfer comment: cues for R hand placement on armrest and to power up with LLE    Ambulation/Gait Ambulation/Gait assistance: Min guard Gait Distance (Feet): 120 Feet Assistive device: Hemi-walker, Straight cane Gait Pattern/deviations: Step-to pattern, Step-through pattern, Decreased stride length       General Gait Details: cues for use of device, progressed from hemiwalker to [facility] as pt not heavily reliant on device. able to take steps in room without device  Stairs            Wheelchair Mobility    Modified Rankin (Stroke Patients Only)       Balance Overall balance assessment: Needs assistance Sitting-balance support: No upper extremity supported Sitting balance-Leahy Scale: Good       Standing balance-Leahy Scale: Fair  Pertinent Vitals/Pain Pain Assessment Pain Assessment: Faces Faces Pain Scale: Hurts a little bit Pain Location: R knee and L shoulder/UE Pain Descriptors / Indicators: Discomfort Pain Intervention(s): Ice applied, Limited activity within  patient's tolerance, Monitored during session, Premedicated before session    Home Living Family/patient expects to be discharged to:: Private residence Living Arrangements: Alone Available Help at Discharge: Family Type of Home: House (condo) Home Access: Level entry;Elevator       Home Layout: One level Home Equipment: None      Prior Function Prior Level of Function : Independent/Modified Independent                     Hand Dominance   Dominant Hand: Right    Extremity/Trunk Assessment   Upper Extremity Assessment Upper Extremity Assessment: Defer to OT evaluation LUE Deficits / Details: ORIF surgery on 9/2 with NO ROM of shoulder allowed per orders. able to wiggle digits minimally. still numbness in digits.    Lower Extremity Assessment Lower Extremity Assessment: RLE deficits/detail RLE Deficits / Details: ankle WFL, able to SLR without assist, KI in place RLE: Unable to fully assess due to immobilization       Communication   Communication: No difficulties  Cognition Arousal/Alertness: Awake/alert Behavior During Therapy: WFL for tasks assessed/performed Overall Cognitive Status: Within Functional Limits for tasks assessed                                          General Comments      Exercises     Assessment/Plan    PT Assessment    PT Problem List         PT Treatment Interventions      PT Goals (Current goals can be found in the Care Plan section)  Acute Rehab PT Goals Patient Stated Goal: home PT Goal Formulation: With patient/family Time For Goal Achievement: 11/06/21 Potential to Achieve Goals: Good    Frequency       Co-evaluation               AM-PAC PT "6 Clicks" Mobility  Outcome Measure Help needed turning from your back to your side while in a flat bed without using bedrails?: None Help needed moving from lying on your back to sitting on the side of a flat bed without using bedrails?:  None Help needed moving to and from a bed to a chair (including a wheelchair)?: A Little Help needed standing up from a chair using your arms (e.g., wheelchair or bedside chair)?: A Little Help needed to walk in hospital room?: A Little Help needed climbing 3-5 steps with a railing? : A Little 6 Click Score: 20    End of Session Equipment Utilized During Treatment: Gait belt Activity Tolerance: Patient tolerated treatment well Patient left: in chair;with call bell/phone within reach;with chair alarm set;with family/visitor present   PT Visit Diagnosis: Other abnormalities of gait and mobility (R26.89)    Time: 1121-1140 PT Time Calculation (min) (ACUTE ONLY): 19 min   Charges:   PT Evaluation $PT Eval Low Complexity: 1 Low          Joachim Carton, PT  Acute Rehab Dept [facility]) [phone]  [facility] Weekend Pager (Saturday/Sunday only)  [phone]  10/23/2021   [facility] 10/23/2021, 11:51 AM

## 2021-10-23 NOTE — Anesthesia Postprocedure Evaluation (Signed)
Anesthesia Post Note  Patient: Diane Bauer  Procedure(s) Performed: OPEN REDUCTION INTERNAL FIXATION (ORIF) PATELLA (Right: Knee)     Anesthesia Post Evaluation No notable events documented.  Last Vitals:  Vitals:   10/23/21 0015 10/23/21 0532  BP: (!) 153/73 (!) 145/63  Pulse: 80 78  Resp: 16 16  Temp: 36.9 C 36.7 C  SpO2: 99% 100%    Last Pain:  Vitals:   10/23/21 1042  TempSrc:   PainSc: 3                  Lewie Loron

## 2021-10-23 NOTE — Plan of Care (Signed)
  Problem: Clinical Measurements: Goal: Respiratory complications will improve Outcome: Progressing   Problem: Clinical Measurements: Goal: Cardiovascular complication will be avoided Outcome: Progressing   Problem: Nutrition: Goal: Adequate nutrition will be maintained Outcome: Progressing   Problem: Pain Managment: Goal: General experience of comfort will improve Outcome: Progressing   Problem: Safety: Goal: Ability to remain free from injury will improve Outcome: Progressing   

## 2021-10-23 NOTE — Plan of Care (Signed)
  Problem: Education: Goal: Knowledge of General Education information will improve Description: Including pain rating scale, medication(s)/side effects and non-pharmacologic comfort measures Outcome: Progressing   Problem: Pain Managment: Goal: General experience of comfort will improve Outcome: Progressing   Problem: Safety: Goal: Ability to remain free from injury will improve Outcome: Progressing   Problem: Education: Goal: Knowledge of the prescribed therapeutic regimen will improve Outcome: Progressing   Problem: Activity: Goal: Ability to avoid complications of mobility impairment will improve Outcome: Progressing   Problem: Clinical Measurements: Goal: Postoperative complications will be avoided or minimized Outcome: Progressing

## 2021-10-23 NOTE — Evaluation (Signed)
Occupational Therapy Evaluation Patient Details Name: Diane Bauer MRN: [id number] DOB: 06-04-1940 Today's Date: 10/23/2021   History of Present Illness 81 y.o. female who is very active and unfortunately injured herself yesterday when she was out shopping and on  concrete sustaining a displaced R patella fracture as well as a left proximal humerus fracture.  She underwent ORIF displaced R patella fracture on 10/21/21 per Dr Victorino Dike  and s/p ORIF L humerus fx per Dr Rennis Chris on 10/22/21. PMH: HTN   Clinical Impression   Therapist provided education and instruction to patient and son in regards to exercises, precautions, positioning, donning upper extremity clothing and bathing while maintaining shoulder precautions, ice and edema management and donning/doffing sling. Patient and son verbalized understanding and demonstrated as needed. Patient needed assistance to complete all ADL tasks with hemi walker on this date with KI in place on RLE. Patient was noted to have decreased functional activity tolerance, decreased endurance, decreased standing balance, decreased safety awareness, and decreased knowledge of AD/AE impacting participation in ADLs. Patient would continue to benefit from skilled OT services at this time while admitted and after d/c to address noted deficits in order to improve overall safety and independence in ADLs.        Recommendations for follow up therapy are one component of a multi-disciplinary discharge planning process, led by the attending physician.  Recommendations may be updated based on patient status, additional functional criteria and insurance authorization.   Follow Up Recommendations  Home health OT    Assistance Recommended at Discharge Frequent or constant Supervision/Assistance  Patient can return home with the following A little help with walking and/or transfers;A little help with bathing/dressing/bathroom;Assistance with cooking/housework;Direct  supervision/assist for financial management;Assist for transportation;Help with stairs or ramp for entrance;Direct supervision/assist for medications management    Functional Status Assessment  Patient has had a recent decline in their functional status and demonstrates the ability to make significant improvements in function in a reasonable and predictable amount of time.  Equipment Recommendations       Recommendations for Other Services       Precautions / Restrictions Precautions Precautions: Shoulder Type of Shoulder Precautions: 2-3 x a day can come out of sling to dangle, genlte ROM of elbow, wrist and hand. gentle pendulums NO AROM/AAROM of shoulder.NWB LUE Shoulder Interventions: Shoulder sling/immobilizer;At all times;Off for dressing/bathing/exercises Required Braces or Orthoses: Knee Immobilizer - Right Knee Immobilizer - Right: On at all times Restrictions Weight Bearing Restrictions: Yes LUE Weight Bearing: Non weight bearing RLE Weight Bearing: Weight bearing as tolerated      Mobility Bed Mobility               General bed mobility comments: in recliner    Transfers                          Balance Overall balance assessment: Needs assistance Sitting-balance support: No upper extremity supported Sitting balance-Leahy Scale: Good       Standing balance-Leahy Scale: Fair                             ADL either performed or assessed with clinical judgement   ADL Overall ADL's : Needs assistance/impaired Eating/Feeding: Set up;Sitting   Grooming: Set up;Sitting   Upper Body Bathing: Minimal assistance;Sitting Upper Body Bathing Details (indicate cue type and reason): with education to patient and son on how to wash under  operated UE with ROM restrictions Lower Body Bathing: Maximal assistance;Sitting/lateral leans Lower Body Bathing Details (indicate cue type and reason): educated on bathing with KI restrictions Upper Body  Dressing : Moderate assistance;Sitting Upper Body Dressing Details (indicate cue type and reason): with education to keep UE still and not try to hlep lift. patients son was able to cue patient at end of session as well. Lower Body Dressing: Maximal assistance Lower Body Dressing Details (indicate cue type and reason): education on how to manipulate pants around Bland Transfer: Minimal assistance;Ambulation Toilet Transfer Details (indicate cue type and reason): with hemiwalker with cues to slow down Toileting- Clothing Manipulation and Hygiene: Sit to/from stand;Minimal assistance Toileting - Clothing Manipulation Details (indicate cue type and reason): with mesh underwear around KI             Vision Baseline Vision/History: 1 Wears glasses Patient Visual Report: No change from baseline       Perception     Praxis      Pertinent Vitals/Pain       Hand Dominance Right   Extremity/Trunk Assessment Upper Extremity Assessment Upper Extremity Assessment: Defer to OT evaluation LUE Deficits / Details: ORIF surgery on 9/2 with NO ROM of shoulder allowed per orders. able to wiggle digits minimally. still numbness in digits.   Lower Extremity Assessment Lower Extremity Assessment: RLE deficits/detail RLE Deficits / Details: ankle WFL, able to SLR without assist, KI in place RLE: Unable to fully assess due to immobilization       Communication Communication Communication: No difficulties   Cognition Arousal/Alertness: Awake/alert Behavior During Therapy: WFL for tasks assessed/performed Overall Cognitive Status: Within Functional Limits for tasks assessed                                       General Comments       Exercises     Shoulder Instructions      Home Living Family/patient expects to be discharged to:: Private residence Living Arrangements: Alone Available Help at Discharge: Family Type of Home: House (condo) Home Access: Level  entry;Elevator     Home Layout: One level     Bathroom Shower/Tub: Chief Strategy Officer: None          Prior Functioning/Environment Prior Level of Function : Independent/Modified Independent                        OT Problem List: Decreased activity tolerance;Decreased knowledge of use of DME or AE;Impaired UE functional use;Decreased knowledge of precautions;Pain      OT Treatment/Interventions: Self-care/ADL training;Therapeutic exercise;Neuromuscular education;Energy conservation;DME and/or AE instruction;Therapeutic activities;Balance training;Patient/family education    OT Goals(Current goals can be found in the care plan section) Acute Rehab OT Goals Patient Stated Goal: to get home soon OT Goal Formulation: With patient/family Time For Goal Achievement: 11/06/21 Potential to Achieve Goals: Fair  OT Frequency: Min 2X/week    Co-evaluation              AM-PAC OT "6 Clicks" Daily Activity     Outcome Measure Help from another person eating meals?: A Little Help from another person taking care of personal grooming?: A Little Help from another person toileting, which includes using toliet, bedpan, or urinal?: A Little Help from another person bathing (including washing, rinsing, drying)?: A Lot Help from another  person to put on and taking off regular upper body clothing?: A Lot Help from another person to put on and taking off regular lower body clothing?: A Lot 6 Click Score: 15   End of Session Equipment Utilized During Treatment: Gait belt;Other (comment) (sling, KI and hemi walker) Nurse Communication: Mobility status  Activity Tolerance: Patient tolerated treatment well Patient left: in chair;with call bell/phone within reach;with family/visitor present  OT Visit Diagnosis: Unsteadiness on feet (R26.81);Pain;History of falling (Z91.81)                Time: 1040-1120 OT Time Calculation (min): 40 min Charges:  OT General  Charges $OT Visit: 1 Visit OT Evaluation $OT Eval Low Complexity: 1 Low OT Treatments $Self Care/Home Management : 23-37 mins  Rosalio Loud, MS Acute Rehabilitation Department Office# [phone]   Barnabas Lister Ladeana Laplant 10/23/2021, 1:23 PM

## 2021-10-23 NOTE — Progress Notes (Signed)
    Subjective: S/p ORIF right patella - Dr. Victorino Dike Patient reports pain in her right knee as mild to moderate. as. Currently Denies N/V/CP/SOB/Abd pain. She states he knee is doing okay. Denies tingling and numbness in LE bilaterally.   S/p ORIF left humerus- Dr. Rennis Chris She states that her shoulder is feeling okay and she is not having much pain right now. She states she is still having tingling and numbness in her fingers and hand but she is able to move her hand and wrist..    Objective:   VITALS:   Vitals:   10/22/21 1827 10/22/21 2159 10/23/21 0015 10/23/21 0532  BP: (!) 133/54 (!) 165/66 (!) 153/73 (!) 145/63  Pulse: 81 92 80 78  Resp: 17 16 16 16   Temp: 98.5 F (36.9 C) 98.8 F (37.1 C) 98.5 F (36.9 C) 98.1 F (36.7 C)  TempSrc: Oral     SpO2: 94% 100% 99% 100%  Weight:      Height:        Patient sitting up in bed. NAD.  Left arm: Adjusted sling on LUE and reminded patient about no active motion of her left shoulder. She has tingling and numbness in her fingers to her elbow. Sensation intact proximal from elbow to her shoulder. She can actively flex and extend her wrist and elbow and make a fist. Palpable radial and ulnar pulses. Neurovascularly intact. Aquacel dressing C/D/I.   Right leg: KI in place with ice. Ace bandage and aquacel dressing C/D/I. Sensory and motor function intact in LE bilaterally. Distal pedal pulses 2+ bilaterally. Mild expected swelling. Compartment soft.    Lab Results  Component Value Date   WBC 6.4 10/21/2021   HGB 13.5 10/21/2021   HCT 39.5 10/21/2021   MCV 101.0 (H) 10/21/2021   PLT 203 10/21/2021   BMET    Component Value Date/Time   NA 132 (L) 10/21/2021 1702   K 3.5 10/21/2021 1702   CL 99 10/21/2021 1702   CO2 24 10/21/2021 1702   GLUCOSE 109 (H) 10/21/2021 1702   BUN 20 10/21/2021 1702   CREATININE 0.73 10/21/2021 1702   CREATININE 0.77 04/07/2011 0820   CALCIUM 9.1 10/21/2021 1702   GFRNONAA >60 10/21/2021 1702      Assessment/Plan: 1 Day Post-Op   Principal Problem:   Right patella fracture  S/p ORIF right patella - Dr. 12/21/2021 10/21/21 WBAT on right lower extremity in the knee immobilizer.    S/p ORIF left humerus- Dr. 12/21/21 10/22/21 - Strict non-weightbearing to left upper extremity.  - Continue with sling immobilization. She may come out of her sling 2-3 times per day to let her arm dangle at her side. Gentle motion of the elbow, wrist, and hand. No active motion of the left shoulder. She may perform gentle pendulum exercises and position of the left arm on a pillow for comfort.   DVT ppx: Aspirin, SCDs, TEDS PO pain control PT/OT: To come by today.  Dispo: Physical therapy to come by today for RLE and LUE maintaining weightbearing precautions. Medications are available for as needed.     12/22/21, PA-C 10/23/2021, 8:31 AM   [facility]  [place] [address], [place], [place] Phone: [phone] [email or web address] Facebook  [phone]

## 2021-10-23 NOTE — Progress Notes (Addendum)
[facility] provider notified of pt wanting to take her vitamins from home. Denny Peon did state pt could Magnesium supplement from home as she previously would. Rn previously notified Denny Peon of pt need for bp medication adjustment as well. These orders were placed earlier in EMR.

## 2021-10-24 MED ORDER — LOSARTAN POTASSIUM-HCTZ 100-25 MG PO TABS: 1.0000 | ORAL_TABLET | Freq: Every day | ORAL | 1 refills | Status: DC

## 2021-10-24 MED ORDER — METHOCARBAMOL 500 MG PO TABS: 500.0000 mg | ORAL_TABLET | Freq: Four times a day (QID) | ORAL | 0 refills | Status: DC | PRN

## 2021-10-24 MED ORDER — HYDROCODONE-ACETAMINOPHEN 5-325 MG PO TABS: 1.0000 | ORAL_TABLET | ORAL | 0 refills | Status: DC | PRN

## 2021-10-24 NOTE — Progress Notes (Signed)
Occupational Therapy Treatment Patient Details Name: Diane Bauer MRN: [id number] DOB: 18-Apr-1940 Today's Date: 10/24/2021   History of present illness 81 y.o. female who is very active and unfortunately injured herself yesterday when she was out shopping and on  concrete sustaining a displaced R patella fracture as well as a left proximal humerus fracture.  She underwent ORIF displaced R patella fracture on 10/21/21 per Dr Victorino Dike  and s/p ORIF L humerus fx per Dr Rennis Chris on 10/22/21. PMH: HTN   OT comments  Patient was able to don oversized t shirt with min A to maintain shoulder restrictions with increased time. Patient was able to don larger pants over KI with mod A to get over RLE. Patient and son were educated on exercises, proper positioning of LUE during activities and exercises,sling use, and participation in ADLs with ROM and WB restrictions.  Patient was able to complete one set of exercises on this date with min guard. Patient and son endorse feeling comfortable to be able to transition home today at patients current assist level. Patient's discharge plan remains appropriate at this time. OT will continue to follow acutely.     Recommendations for follow up therapy are one component of a multi-disciplinary discharge planning process, led by the attending physician.  Recommendations may be updated based on patient status, additional functional criteria and insurance authorization.    Follow Up Recommendations  Home health OT    Assistance Recommended at Discharge Frequent or constant Supervision/Assistance  Patient can return home with the following  A little help with walking and/or transfers;A little help with bathing/dressing/bathroom;Assistance with cooking/housework;Direct supervision/assist for financial management;Assist for transportation;Help with stairs or ramp for entrance;Direct supervision/assist for medications management   Equipment Recommendations        Recommendations for Other Services      Precautions / Restrictions Precautions Precautions: Shoulder Type of Shoulder Precautions: 2-3 x a day can come out of sling to dangle, genlte ROM of elbow, wrist and hand. gentle pendulums NO AROM/AAROM of shoulder.NWB LUE Shoulder Interventions: Shoulder sling/immobilizer;At all times;Off for dressing/bathing/exercises Precaution Booklet Issued: Yes (comment) Required Braces or Orthoses: Knee Immobilizer - Right Knee Immobilizer - Right: On at all times Restrictions Weight Bearing Restrictions: Yes LUE Weight Bearing: Non weight bearing RLE Weight Bearing: Weight bearing as tolerated       Mobility Bed Mobility               General bed mobility comments: in recliner    Transfers                         Balance Overall balance assessment: Needs assistance Sitting-balance support: No upper extremity supported Sitting balance-Leahy Scale: Good       Standing balance-Leahy Scale: Fair                             ADL either performed or assessed with clinical judgement   ADL Overall ADL's : Needs assistance/impaired         Upper Body Bathing: Standing;Minimal assistance;Cueing for UE precautions Upper Body Bathing Details (indicate cue type and reason): at sink Lower Body Bathing: Moderate assistance Lower Body Bathing Details (indicate cue type and reason): educated on bathing with KI restrictions Upper Body Dressing : Moderate assistance;Sitting Upper Body Dressing Details (indicate cue type and reason): with education to keep UE still and not try to hlep lift. . Lower Body Dressing:  Maximal assistance Lower Body Dressing Details (indicate cue type and reason): education on how to manipulate pants around [employer] Transfer: Supervision/safety;Ambulation Toilet Transfer Details (indicate cue type and reason): with straight cane with increased time Toileting- Clothing Manipulation and Hygiene:  Supervision/safety Toileting - Clothing Manipulation Details (indicate cue type and reason): loose larger pants            Extremity/Trunk Assessment              Vision       Perception     Praxis      Cognition Arousal/Alertness: Awake/alert Behavior During Therapy: WFL for tasks assessed/performed Overall Cognitive Status: Within Functional Limits for tasks assessed                                          Exercises Shoulder Exercises Pendulum Exercise: Standing (leaning side to side and forwards and backwards with min guard. education provided on swaying not moving UE) Elbow Flexion: AROM, 10 reps Wrist Flexion: AROM, 10 reps Wrist Extension: 10 reps, AROM    Shoulder Instructions       General Comments      Pertinent Vitals/ Pain       Pain Assessment Pain Assessment: Faces Faces Pain Scale: Hurts a little bit Pain Location: R knee and L shoulder/UE Pain Descriptors / Indicators: Discomfort Pain Intervention(s): Monitored during session, Repositioned, Premedicated before session  Home Living                                          Prior Functioning/Environment              Frequency  Min 2X/week        Progress Toward Goals  OT Goals(current goals can now be found in the care plan section)  Progress towards OT goals: Progressing toward goals     Plan Discharge plan remains appropriate    Co-evaluation                 AM-PAC OT "6 Clicks" Daily Activity     Outcome Measure   Help from another person eating meals?: A Little Help from another person taking care of personal grooming?: A Little Help from another person toileting, which includes using toliet, bedpan, or urinal?: A Little Help from another person bathing (including washing, rinsing, drying)?: A Lot Help from another person to put on and taking off regular upper body clothing?: A Lot Help from another person to put on and  taking off regular lower body clothing?: A Lot 6 Click Score: 15    End of Session Equipment Utilized During Treatment: Other (comment) (sling, KI and SPC)  OT Visit Diagnosis: Unsteadiness on feet (R26.81);Pain;History of falling (Z91.81)   Activity Tolerance     Patient Left in chair;with call bell/phone within reach;with family/visitor present   Nurse Communication Mobility status        Time: 2694-8546 OT Time Calculation (min): 36 min  Charges: OT General Charges $OT Visit: 1 Visit OT Treatments $Self Care/Home Management : 23-37 mins  Rosalio Loud, MS Acute Rehabilitation Department Office# [phone]   Ardyth Harps 10/24/2021, 10:35 AM

## 2021-10-24 NOTE — Discharge Summary (Signed)
In most cases prophylactic antibiotics for Dental procdeures after total joint surgery are not necessary.  Exceptions are as follows:  1. History of prior total joint infection  2. Severely immunocompromised (Organ Transplant, cancer chemotherapy, Rheumatoid biologic meds such as Humera)  3. Poorly controlled diabetes (A1C &gt; 8.0, blood glucose over 200)  If you have one of these conditions, contact your surgeon for an antibiotic prescription, prior to your dental procedure. Orthopedic Discharge Summary        Physician Discharge Summary  Patient ID: Diane Bauer MRN: [id number] DOB/AGE: 03/01/1940 81 y.o.  Admit date: 10/21/2021 Discharge date: 10/24/2021   Procedures:  Procedure(s) (LRB): OPEN REDUCTION INTERNAL FIXATION (ORIF) PROXIMAL HUMERUS FRACTURE (Left)  Attending Physician:  Dr. Toni Arthurs   Admission Diagnoses:   right patella fracture, left humerus fracture  Discharge Diagnoses:  right patella fracture, left humerus fracture   Past Medical History:  Diagnosis Date   Bronchospasm    occassional   COPD (chronic obstructive pulmonary disease) (HCC)    by CXR   Hypertension    Hypothyroidism    Osteopenia     PCP: Deloris Ping, MD   Discharged Condition: good  Hospital Course:  Patient underwent the above stated procedure on 10/21/2021. Patient tolerated the procedure well and brought to the recovery room in good condition and subsequently to the floor. Patient had an uncomplicated hospital course and was stable for discharge.   Disposition: Discharge disposition: 01-Home or Self Care      with follow up in 2 weeks    Follow-up Information     Francena Hanly, MD Follow up.   Specialty: Orthopedic Surgery Why: in 10-14 days, call the office to coordinate a follow up time on same day with both Dr. Rennis Chris and Dr. Louie Boston information: [address] [place] [address] [phone]                  Dental Antibiotics:  In most cases prophylactic antibiotics for Dental procdeures after total joint surgery are not necessary.  Exceptions are as follows:  1. History of prior total joint infection  2. Severely immunocompromised (Organ Transplant, cancer chemotherapy, Rheumatoid biologic meds such as Humera)  3. Poorly controlled diabetes (A1C &gt; 8.0, blood glucose over 200)  If you have one of these conditions, contact your surgeon for an antibiotic prescription, prior to your dental procedure.  Discharge Instructions     Call MD / Call 911   Complete by: As directed    If you experience chest pain or shortness of breath, CALL 911 and be transported to the hospital emergency room.  If you develope a fever above 101 F, pus (white drainage) or increased drainage or redness at the wound, or calf pain, call your surgeon's office.   Constipation Prevention   Complete by: As directed    Drink plenty of fluids.  Prune juice may be helpful.  You may use a stool softener, such as Colace (over the counter) 100 mg twice a day.  Use MiraLax (over the counter) for constipation as needed.   Diet - low sodium heart healthy   Complete by: As directed    Increase activity slowly as tolerated   Complete by: As directed    Post-operative opioid taper instructions:   Complete by: As directed    POST-OPERATIVE OPIOID TAPER INSTRUCTIONS: It is important to wean off of your opioid medication as soon as possible. If you do not need pain medication  after your surgery it is ok to stop day one. Opioids include: Codeine, Hydrocodone(Norco, Vicodin), Oxycodone(Percocet, oxycontin) and hydromorphone amongst others.  Long term and even short term use of opiods can cause: Increased pain response Dependence Constipation Depression Respiratory depression And more.  Withdrawal symptoms can include Flu like symptoms Nausea, vomiting And more Techniques to manage these symptoms Hydrate  well Eat regular healthy meals Stay active Use relaxation techniques(deep breathing, meditating, yoga) Do Not substitute Alcohol to help with tapering If you have been on opioids for less than two weeks and do not have pain than it is ok to stop all together.  Plan to wean off of opioids This plan should start within one week post op of your joint replacement. Maintain the same interval or time between taking each dose and first decrease the dose.  Cut the total daily intake of opioids by one tablet each day Next start to increase the time between doses. The last dose that should be eliminated is the evening dose.          Allergies as of 10/24/2021       Reactions   Amoxicillin-pot Clavulanate Diarrhea        Medication List     TAKE these medications    amLODipine 2.5 MG tablet Commonly known as: NORVASC Take 1 tablet (2.5 mg total) by mouth daily.   aspirin 81 MG tablet Take 81 mg by mouth daily.   cholecalciferol 1000 units tablet Commonly known as: VITAMIN D Take 1,000 Units by mouth daily.   fish oil-omega-3 fatty acids 1000 MG capsule Take 2 g by mouth daily.   losartan-hydrochlorothiazide 100-25 MG tablet Commonly known as: HYZAAR Take 1 tablet by mouth daily. What changed: how much to take   multivitamin tablet Take 1 tablet by mouth daily.   Pfizer COVID-19 Vac Bivalent injection Generic drug: COVID-19 mRNA bivalent vaccine Proofreader) Inject into the muscle.   thyroid 65 MG tablet Commonly known as: ARMOUR Take one and 1/2 tablet daily What changed:  how much to take how to take this when to take this additional instructions   Tirosint 88 MCG Caps Generic drug: Levothyroxine Sodium Take 1 capsule by mouth daily.   vitamin B-12 50 MCG tablet Commonly known as: CYANOCOBALAMIN Take 50 mcg by mouth daily.          Signed: Thea Gist 10/24/2021, 11:54 AM  [facility] is now [employer] [address], [address], [place], [place] [address] Phone: [phone] Facebook  Instagram  [employer]

## 2021-10-24 NOTE — Progress Notes (Signed)
Physical Therapy Treatment Patient Details Name: Diane Bauer MRN: [id number] DOB: 13-Jul-1940 Today's Date: 10/24/2021   History of Present Illness 81 y.o. female who is very active and unfortunately injured herself yesterday when she was out shopping and on  concrete sustaining a displaced R patella fracture as well as a left proximal humerus fracture.  She underwent ORIF displaced R patella fracture on 10/21/21 per Dr Victorino Dike  and s/p ORIF L humerus fx per Dr Rennis Chris on 10/22/21. PMH: HTN    PT Comments    Pt is progressing very well, meeting PT goals. Ready for d/c with son's assist prn from PT standpoint.   Recommendations for follow up therapy are one component of a multi-disciplinary discharge planning process, led by the attending physician.  Recommendations may be updated based on patient status, additional functional criteria and insurance authorization.  Follow Up Recommendations  Other (comment) (HH vs OPPT)     Assistance Recommended at Discharge Intermittent Supervision/Assistance  Patient can return home with the following Help with stairs or ramp for entrance;Assistance with cooking/housework;Assist for transportation;A little help with bathing/dressing/bathroom   Equipment Recommendations       Recommendations for Other Services       Precautions / Restrictions Precautions Precautions: Shoulder Type of Shoulder Precautions: 2-3 x a day can come out of sling to dangle, genlte ROM of elbow, wrist and hand. gentle pendulums NO AROM/AAROM of shoulder.NWB LUE Shoulder Interventions: Shoulder sling/immobilizer;At all times;Off for dressing/bathing/exercises Precaution Booklet Issued: Yes (comment) Required Braces or Orthoses: Knee Immobilizer - Right Knee Immobilizer - Right: On at all times Restrictions Weight Bearing Restrictions: Yes LUE Weight Bearing: Non weight bearing RLE Weight Bearing: Weight bearing as tolerated     Mobility  Bed Mobility                General bed mobility comments: in recliner    Transfers Overall transfer level: Needs assistance Equipment used: None Transfers: Sit to/from Stand Sit to Stand: Supervision, Modified independent (Device/Increase time)                Ambulation/Gait Ambulation/Gait assistance: Supervision, Min guard Gait Distance (Feet): 350 Feet Assistive device: Straight cane Gait Pattern/deviations: Step-to pattern, Step-through pattern, Decreased stride length       General Gait Details: good stability with SPC, no LOB. progression to step through gait   Stairs Stairs: Yes   Stair Management: One rail Right, Step to pattern Number of Stairs: 3 General stair comments: cues for sequence, good stability   Wheelchair Mobility    Modified Rankin (Stroke Patients Only)       Balance                             High level balance activites: Backward walking, Direction changes, Turns, Head turns High Level Balance Comments: good stability, no LOB            Cognition Arousal/Alertness: Awake/alert Behavior During Therapy: WFL for tasks assessed/performed Overall Cognitive Status: Within Functional Limits for tasks assessed                                          Exercises      General Comments        Pertinent Vitals/Pain Pain Assessment Pain Assessment: Faces Faces Pain Scale: Hurts a little bit Pain Location: R knee  and L shoulder/UE Pain Descriptors / Indicators: Discomfort Pain Intervention(s): Limited activity within patient's tolerance, Monitored during session, Premedicated before session    Home Living                          Prior Function            PT Goals (current goals can now be found in the care plan section) Acute Rehab PT Goals Patient Stated Goal: home PT Goal Formulation: With patient/family Time For Goal Achievement: 11/06/21 Potential to Achieve Goals: Good Progress towards PT  goals: Progressing toward goals    Frequency           PT Plan Current plan remains appropriate    Co-evaluation              AM-PAC PT "6 Clicks" Mobility   Outcome Measure  Help needed turning from your back to your side while in a flat bed without using bedrails?: None Help needed moving from lying on your back to sitting on the side of a flat bed without using bedrails?: None Help needed moving to and from a bed to a chair (including a wheelchair)?: None Help needed standing up from a chair using your arms (e.g., wheelchair or bedside chair)?: None Help needed to walk in hospital room?: A Little Help needed climbing 3-5 steps with a railing? : A Little 6 Click Score: 22    End of Session Equipment Utilized During Treatment: Gait belt;Right knee immobilizer Activity Tolerance: Patient tolerated treatment well Patient left: in chair;with call bell/phone within reach;with chair alarm set;with family/visitor present Nurse Communication: Mobility status PT Visit Diagnosis: Other abnormalities of gait and mobility (R26.89)     Time: 1035-1050 PT Time Calculation (min) (ACUTE ONLY): 15 min  Charges:  $Gait Training: 8-22 mins                     Delice Bison, PT  Acute Rehab Dept [facility]) [phone]  [facility] Weekend Pager (Saturday/Sunday only)  [phone]  10/24/2021\   [facility] 10/24/2021, 11:00 AM

## 2021-10-24 NOTE — Progress Notes (Signed)
   Subjective: 2 Days Post-Op Procedure(s) (LRB): OPEN REDUCTION INTERNAL FIXATION (ORIF) PROXIMAL HUMERUS FRACTURE (Left)  Pt c/o mild pain/soreness Left shoulder spasms Therapy went well yesterday and ambulated well Denies any new symptoms or issues Patient reports pain as mild.  Objective:   VITALS:   Vitals:   10/23/21 2127 10/24/21 0609  BP: (!) 156/64 (!) 153/62  Pulse: 81 85  Resp: 18 18  Temp: 98 F (36.7 C) 98.8 F (37.1 C)  SpO2: 96% 97%    Left shoulder: well healing incision, dressing in place  Nv intact distally Sling in good position No rashes or signs of drainage Right knee immobilizer and dressing in place Nv intact distally No rashes or edema distally  Weight bearing as tolerated with brace  LABS Recent Labs    10/21/21 1702  HGB 13.5  HCT 39.5  WBC 6.4  PLT 203    Recent Labs    10/21/21 1702  NA 132*  K 3.5  BUN 20  CREATININE 0.73  GLUCOSE 109*     Assessment/Plan: 2 Days Post-Op Procedure(s) (LRB): OPEN REDUCTION INTERNAL FIXATION (ORIF) PROXIMAL HUMERUS FRACTURE (Left) Continue with PT/OT Plan to d/c home today after therapy Pain management F/u in 2 weeks in the office    Brad Ronnie Mallette PA-C, [facility] is now [employer]  [place] [address], [place], [place] [address] Phone: [phone] [email or web address] Facebook  [employer]

## 2021-10-24 NOTE — Plan of Care (Signed)
  Problem: Education: Goal: Knowledge of General Education information will improve Description: Including pain rating scale, medication(s)/side effects and non-pharmacologic comfort measures Outcome: Adequate for Discharge   Problem: Health Behavior/Discharge Planning: Goal: Ability to manage health-related needs will improve Outcome: Adequate for Discharge   Problem: Clinical Measurements: Goal: Ability to maintain clinical measurements within normal limits will improve Outcome: Adequate for Discharge Goal: Will remain free from infection Outcome: Adequate for Discharge Goal: Diagnostic test results will improve Outcome: Adequate for Discharge Goal: Respiratory complications will improve Outcome: Adequate for Discharge Goal: Cardiovascular complication will be avoided Outcome: Adequate for Discharge   Problem: Activity: Goal: Risk for activity intolerance will decrease Outcome: Adequate for Discharge   Problem: Nutrition: Goal: Adequate nutrition will be maintained Outcome: Adequate for Discharge   Problem: Coping: Goal: Level of anxiety will decrease Outcome: Adequate for Discharge   Problem: Elimination: Goal: Will not experience complications related to bowel motility Outcome: Adequate for Discharge Goal: Will not experience complications related to urinary retention Outcome: Adequate for Discharge   Problem: Pain Managment: Goal: General experience of comfort will improve Outcome: Adequate for Discharge   Problem: Safety: Goal: Ability to remain free from injury will improve Outcome: Adequate for Discharge   Problem: Skin Integrity: Goal: Risk for impaired skin integrity will decrease Outcome: Adequate for Discharge   Problem: Education: Goal: Knowledge of the prescribed therapeutic regimen will improve Outcome: Adequate for Discharge Goal: Individualized Educational Video(s) Outcome: Adequate for Discharge   Problem: Activity: Goal: Ability to avoid  complications of mobility impairment will improve Outcome: Adequate for Discharge Goal: Range of joint motion will improve Outcome: Adequate for Discharge   Problem: Clinical Measurements: Goal: Postoperative complications will be avoided or minimized Outcome: Adequate for Discharge   Problem: Pain Management: Goal: Pain level will decrease with appropriate interventions Outcome: Adequate for Discharge   Problem: Skin Integrity: Goal: Will show signs of wound healing Outcome: Adequate for Discharge   Problem: Education: Goal: Knowledge of the prescribed therapeutic regimen will improve Outcome: Adequate for Discharge Goal: Understanding of activity limitations/precautions following surgery will improve Outcome: Adequate for Discharge Goal: Individualized Educational Video(s) Outcome: Adequate for Discharge   Problem: Activity: Goal: Ability to tolerate increased activity will improve Outcome: Adequate for Discharge   Problem: Pain Management: Goal: Pain level will decrease with appropriate interventions Outcome: Adequate for Discharge

## 2021-10-25 ENCOUNTER — Encounter (HOSPITAL_COMMUNITY): Payer: Self-pay | Admitting: Orthopedic Surgery

## 2021-10-26 ENCOUNTER — Encounter (HOSPITAL_COMMUNITY): Payer: Self-pay | Admitting: Orthopedic Surgery

## 2021-11-03 NOTE — Progress Notes (Signed)
COVID Vaccine Completed: yes x6  Date of COVID positive in last 90 days:  PCP - Pricilla Holm, MD Cardiologist -   Chest x-ray - 10/21/21 Epic EKG - 10/21/21 Epic Stress Test -  [place] -  Cardiac Cath -  Pacemaker/ICD device last checked: Spinal Cord Stimulator:  Bowel Prep -   Sleep Study -  CPAP -   Fasting Blood Sugar -  Checks Blood Sugar _____ times a day  Blood Thinner Instructions: Aspirin Instructions: Last Dose:  Activity level:  Can go up a flight of stairs and perform activities of daily living without stopping and without symptoms of chest pain or shortness of breath.  Able to exercise without symptoms  Unable to go up a flight of stairs without symptoms of     Anesthesia review:   Patient denies shortness of breath, fever, cough and chest pain at PAT appointment  Patient verbalized understanding of instructions that were given to them at the PAT appointment. Patient was also instructed that they will need to review over the PAT instructions again at home before surgery.

## 2021-11-03 NOTE — Patient Instructions (Signed)
SURGICAL WAITING ROOM VISITATION Patients having surgery or a procedure may have no more than 2 support people in the waiting area - these visitors may rotate.   Children under the age of 41 must have an adult with them who is not the patient. If the patient needs to stay at the hospital during part of their recovery, the visitor guidelines for inpatient rooms apply. Pre-op nurse will coordinate an appropriate time for 1 support person to accompany patient in pre-op.  This support person may not rotate.    Please refer to the [facility] website for the visitor guidelines for Inpatients (after your surgery is over and you are in a regular room).    Your procedure is scheduled on: 11/08/21   Report to [facility] Main Entrance    Report to admitting at 12:30 PM   Call this number if you have problems the morning of surgery [phone]   Do not eat food :After Midnight.   After Midnight you may have the following liquids until 12:00 PM DAY OF SURGERY  Water Non-Citrus Juices (without pulp, NO RED) Carbonated Beverages Black Coffee (NO MILK/CREAM OR CREAMERS, sugar ok)  Clear Tea (NO MILK/CREAM OR CREAMERS, sugar ok) regular and decaf                             Plain Jell-O (NO RED)                                           Fruit ices (not with fruit pulp, NO RED)                                     Popsicles (NO RED)                                                               Sports drinks like Gatorade (NO RED)              Drink 2 Ensure/G2 drinks AT 10:00 PM the night before surgery.        The day of surgery:  Drink ONE (1) Pre-Surgery Clear Ensure or G2 at AM the morning of surgery. Drink in one sitting. Do not sip.  This drink was given to you during your hospital  pre-op appointment visit. Nothing else to drink after completing the  Pre-Surgery Clear Ensure or G2.          If you have questions, please contact your surgeon's office.   FOLLOW BOWEL PREP AND  ANY ADDITIONAL PRE OP INSTRUCTIONS YOU RECEIVED FROM YOUR SURGEON'S OFFICE!!!     Oral Hygiene is also important to reduce your risk of infection.                                    Remember - BRUSH YOUR TEETH THE MORNING OF SURGERY WITH YOUR REGULAR TOOTHPASTE   Do NOT smoke after Midnight   Take these medicines the morning of surgery  with A SIP OF WATER: Amlodipine, Norco, Thyroid   DO NOT TAKE ANY ORAL DIABETIC MEDICATIONS DAY OF YOUR SURGERY  Bring CPAP mask and tubing day of surgery.                              You may not have any metal on your body including hair pins, jewelry, and body piercing             Do not wear make-up, lotions, powders, perfumes, or deodorant  Do not wear nail polish including gel and S&S, artificial/acrylic nails, or any other type of covering on natural nails including finger and toenails. If you have artificial nails, gel coating, etc. that needs to be removed by a nail salon please have this removed prior to surgery or surgery may need to be canceled/ delayed if the surgeon/ anesthesia feels like they are unable to be safely monitored.   Do not shave  48 hours prior to surgery.    Do not bring valuables to the hospital. [facility] IS NOT             RESPONSIBLE   FOR VALUABLES.   Contacts, dentures or bridgework may not be worn into surgery.   Bring small overnight bag day of surgery.   DO NOT BRING YOUR HOME MEDICATIONS TO THE HOSPITAL. PHARMACY WILL DISPENSE MEDICATIONS LISTED ON YOUR MEDICATION LIST TO YOU DURING YOUR ADMISSION IN THE HOSPITAL!    Special Instructions: Bring a copy of your healthcare power of attorney and living will documents         the day of surgery if you haven't scanned them before.              Please read over the following fact sheets you were given: IF YOU HAVE QUESTIONS ABOUT YOUR PRE-OP INSTRUCTIONS PLEASE CALL [phone]- [facility] - Preparing for Surgery Before surgery, you can play an  important role.  Because skin is not sterile, your skin needs to be as free of germs as possible.  You can reduce the number of germs on your skin by washing with CHG (chlorahexidine gluconate) soap before surgery.  CHG is an antiseptic cleaner which kills germs and bonds with the skin to continue killing germs even after washing. Please DO NOT use if you have an allergy to CHG or antibacterial soaps.  If your skin becomes reddened/irritated stop using the CHG and inform your nurse when you arrive at Short Stay. Do not shave (including legs and underarms) for at least 48 hours prior to the first CHG shower.  You may shave your face/neck.  Please follow these instructions carefully:  1.  Shower with CHG Soap the night before surgery and the  morning of surgery.  2.  If you choose to wash your hair, wash your hair first as usual with your normal  shampoo.  3.  After you shampoo, rinse your hair and body thoroughly to remove the shampoo.                             4.  Use CHG as you would any other liquid soap.  You can apply chg directly to the skin and wash.  Gently with a scrungie or clean washcloth.  5.  Apply the CHG Soap to your body ONLY FROM THE NECK DOWN.   Do  not use on face/ open                           Wound or open sores. Avoid contact with eyes, ears mouth and   genitals (private parts).                       Wash face,  Genitals (private parts) with your normal soap.             6.  Wash thoroughly, paying special attention to the area where your    surgery  will be performed.  7.  Thoroughly rinse your body with warm water from the neck down.  8.  DO NOT shower/wash with your normal soap after using and rinsing off the CHG Soap.                9.  Pat yourself dry with a clean towel.            10.  Wear clean pajamas.            11.  Place clean sheets on your bed the night of your first shower and do not  sleep with pets. Day of Surgery : Do not apply any lotions/deodorants the  morning of surgery.  Please wear clean clothes to the hospital/surgery center.  FAILURE TO FOLLOW THESE INSTRUCTIONS MAY RESULT IN THE CANCELLATION OF YOUR SURGERY  PATIENT SIGNATURE_________________________________  NURSE SIGNATURE__________________________________  ________________________________________________________________________

## 2021-11-03 NOTE — Progress Notes (Signed)
Please place orders for PAT appointment scheduled 11/04/21. 

## 2021-11-04 ENCOUNTER — Encounter (HOSPITAL_COMMUNITY): Payer: Self-pay

## 2021-11-04 ENCOUNTER — Encounter (HOSPITAL_COMMUNITY)
Admission: RE | Admit: 2021-11-04 | Discharge: 2021-11-04 | Disposition: A | Discharge status: Home / Self Care | Payer: Medicare HMO | Source: Ambulatory Visit | Attending: Orthopedic Surgery | Admitting: Orthopedic Surgery

## 2021-11-04 VITALS — BP 165/67 | HR 77 | Temp 98.7°F | Resp 14 | Ht 66.0 in | Wt 119.0 lb

## 2021-11-04 DIAGNOSIS — Z01812 Encounter for preprocedural laboratory examination: Secondary | ICD-10-CM | POA: Diagnosis present

## 2021-11-04 DIAGNOSIS — Z01818 Encounter for other preprocedural examination: Secondary | ICD-10-CM

## 2021-11-04 HISTORY — DX: Other specified postprocedural states: Z98.890

## 2021-11-04 HISTORY — DX: Headache, unspecified: R51.9

## 2021-11-04 LAB — SURGICAL PCR SCREEN
MRSA, PCR: NEGATIVE
Staphylococcus aureus: NEGATIVE

## 2021-11-04 NOTE — Progress Notes (Signed)
Need to collect Type and screen DOS as paperwork was not signed by the patient.

## 2021-11-08 ENCOUNTER — Ambulatory Visit (HOSPITAL_BASED_OUTPATIENT_CLINIC_OR_DEPARTMENT_OTHER): Payer: Medicare HMO | Admitting: Certified Registered Nurse Anesthetist

## 2021-11-08 ENCOUNTER — Other Ambulatory Visit: Payer: Self-pay

## 2021-11-08 ENCOUNTER — Ambulatory Visit (HOSPITAL_COMMUNITY)
Admission: RE | Admit: 2021-11-08 | Discharge: 2021-11-09 | Disposition: A | Discharge status: Home / Self Care | Payer: Medicare HMO | Attending: Orthopedic Surgery | Admitting: Orthopedic Surgery

## 2021-11-08 ENCOUNTER — Ambulatory Visit (HOSPITAL_COMMUNITY): Payer: Medicare HMO | Admitting: Certified Registered Nurse Anesthetist

## 2021-11-08 ENCOUNTER — Encounter (HOSPITAL_COMMUNITY): Payer: Self-pay | Admitting: Orthopedic Surgery

## 2021-11-08 ENCOUNTER — Encounter (HOSPITAL_COMMUNITY)
Admission: RE | Disposition: A | Discharge status: Home / Self Care | Payer: Self-pay | Source: Home / Self Care | Attending: Orthopedic Surgery

## 2021-11-08 DIAGNOSIS — J449 Chronic obstructive pulmonary disease, unspecified: Secondary | ICD-10-CM | POA: Insufficient documentation

## 2021-11-08 DIAGNOSIS — T84191A Other mechanical complication of internal fixation device of left humerus, initial encounter: Secondary | ICD-10-CM

## 2021-11-08 DIAGNOSIS — Z01818 Encounter for other preprocedural examination: Secondary | ICD-10-CM

## 2021-11-08 DIAGNOSIS — T85698A Other mechanical complication of other specified internal prosthetic devices, implants and grafts, initial encounter: Secondary | ICD-10-CM | POA: Insufficient documentation

## 2021-11-08 DIAGNOSIS — I10 Essential (primary) hypertension: Secondary | ICD-10-CM | POA: Diagnosis not present

## 2021-11-08 DIAGNOSIS — Z96612 Presence of left artificial shoulder joint: Secondary | ICD-10-CM | POA: Insufficient documentation

## 2021-11-08 DIAGNOSIS — E039 Hypothyroidism, unspecified: Secondary | ICD-10-CM | POA: Diagnosis not present

## 2021-11-08 DIAGNOSIS — X58XXXA Exposure to other specified factors, initial encounter: Secondary | ICD-10-CM | POA: Insufficient documentation

## 2021-11-08 HISTORY — PX: REVERSE SHOULDER ARTHROPLASTY: SHX5054

## 2021-11-08 HISTORY — PX: HARDWARE REMOVAL: SHX979

## 2021-11-08 LAB — TYPE AND SCREEN
ABO/RH(D): O NEG
Antibody Screen: NEGATIVE

## 2021-11-08 SURGERY — ARTHROPLASTY, SHOULDER, TOTAL, REVERSE
Anesthesia: Regional | Site: Shoulder | Laterality: Left

## 2021-11-08 MED ORDER — VANCOMYCIN HCL 1000 MG IV SOLR
INTRAVENOUS | Status: AC
  Filled 2021-11-08: qty 20

## 2021-11-08 MED ORDER — PROPOFOL 10 MG/ML IV BOLUS
INTRAVENOUS | Status: DC | PRN
  Administered 2021-11-08: 100 mg via INTRAVENOUS

## 2021-11-08 MED ORDER — OXYCODONE HCL 5 MG/5ML PO SOLN: 5.0000 mg | Freq: Once | ORAL | Status: DC | PRN

## 2021-11-08 MED ORDER — PHENYLEPHRINE HCL-NACL 20-0.9 MG/250ML-% IV SOLN
INTRAVENOUS | Status: DC | PRN
  Administered 2021-11-08: 50 ug/min via INTRAVENOUS

## 2021-11-08 MED ORDER — LACTATED RINGERS IV SOLN: INTRAVENOUS | Status: DC

## 2021-11-08 MED ORDER — VANCOMYCIN HCL 1000 MG IV SOLR
INTRAVENOUS | Status: DC | PRN
  Administered 2021-11-08: 1000 mg via TOPICAL

## 2021-11-08 MED ORDER — ONDANSETRON HCL 4 MG/2ML IJ SOLN: 4.0000 mg | Freq: Four times a day (QID) | INTRAMUSCULAR | Status: DC | PRN

## 2021-11-08 MED ORDER — ACETAMINOPHEN 325 MG PO TABS: 325.0000 mg | ORAL_TABLET | Freq: Four times a day (QID) | ORAL | Status: DC | PRN

## 2021-11-08 MED ORDER — 0.9 % SODIUM CHLORIDE (POUR BTL) OPTIME
TOPICAL | Status: DC | PRN
  Administered 2021-11-08: 1000 mL

## 2021-11-08 MED ORDER — PROPOFOL 10 MG/ML IV BOLUS
INTRAVENOUS | Status: AC
  Filled 2021-11-08: qty 20

## 2021-11-08 MED ORDER — PHENYLEPHRINE HCL (PRESSORS) 10 MG/ML IV SOLN
INTRAVENOUS | Status: AC
  Filled 2021-11-08: qty 1

## 2021-11-08 MED ORDER — OXYCODONE HCL 5 MG PO TABS: 5.0000 mg | ORAL_TABLET | Freq: Once | ORAL | Status: DC | PRN

## 2021-11-08 MED ORDER — PHENOL 1.4 % MT LIQD: 1.0000 | OROMUCOSAL | Status: DC | PRN

## 2021-11-08 MED ORDER — HYDROMORPHONE HCL 1 MG/ML IJ SOLN: 0.2500 mg | INTRAMUSCULAR | Status: DC | PRN

## 2021-11-08 MED ORDER — TRANEXAMIC ACID-NACL 1000-0.7 MG/100ML-% IV SOLN
1000.0000 mg | INTRAVENOUS | Status: AC
  Administered 2021-11-08: 1000 mg via INTRAVENOUS
  Filled 2021-11-08: qty 100

## 2021-11-08 MED ORDER — MENTHOL 3 MG MT LOZG: 1.0000 | LOZENGE | OROMUCOSAL | Status: DC | PRN

## 2021-11-08 MED ORDER — METOCLOPRAMIDE HCL 5 MG/ML IJ SOLN: 5.0000 mg | Freq: Three times a day (TID) | INTRAMUSCULAR | Status: DC | PRN

## 2021-11-08 MED ORDER — AMLODIPINE BESYLATE 5 MG PO TABS
2.5000 mg | ORAL_TABLET | Freq: Every day | ORAL | Status: DC
  Filled 2021-11-08: qty 1

## 2021-11-08 MED ORDER — FENTANYL CITRATE (PF) 100 MCG/2ML IJ SOLN
INTRAMUSCULAR | Status: DC | PRN
  Administered 2021-11-08: 50 ug via INTRAVENOUS

## 2021-11-08 MED ORDER — DIPHENHYDRAMINE HCL 12.5 MG/5ML PO ELIX: 12.5000 mg | ORAL_SOLUTION | ORAL | Status: DC | PRN

## 2021-11-08 MED ORDER — CEFAZOLIN SODIUM-DEXTROSE 2-4 GM/100ML-% IV SOLN
2.0000 g | INTRAVENOUS | Status: AC
  Administered 2021-11-08: 2 g via INTRAVENOUS
  Filled 2021-11-08: qty 100

## 2021-11-08 MED ORDER — ALUM & MAG HYDROXIDE-SIMETH 200-200-20 MG/5ML PO SUSP: 30.0000 mL | ORAL | Status: DC | PRN

## 2021-11-08 MED ORDER — METHOCARBAMOL 500 MG PO TABS
500.0000 mg | ORAL_TABLET | Freq: Four times a day (QID) | ORAL | Status: DC | PRN
  Administered 2021-11-08 – 2021-11-09 (×2): 500 mg via ORAL
  Filled 2021-11-08 (×2): qty 1

## 2021-11-08 MED ORDER — DOCUSATE SODIUM 100 MG PO CAPS
100.0000 mg | ORAL_CAPSULE | Freq: Two times a day (BID) | ORAL | Status: DC
  Administered 2021-11-08 – 2021-11-09 (×2): 100 mg via ORAL
  Filled 2021-11-08 (×2): qty 1

## 2021-11-08 MED ORDER — PROMETHAZINE HCL 25 MG/ML IJ SOLN: 6.2500 mg | INTRAMUSCULAR | Status: DC | PRN

## 2021-11-08 MED ORDER — MORPHINE SULFATE (PF) 2 MG/ML IV SOLN: 0.5000 mg | INTRAVENOUS | Status: DC | PRN

## 2021-11-08 MED ORDER — BUPIVACAINE HCL (PF) 0.5 % IJ SOLN
INTRAMUSCULAR | Status: DC | PRN
  Administered 2021-11-08: 20 mL via PERINEURAL

## 2021-11-08 MED ORDER — MIDAZOLAM HCL 2 MG/2ML IJ SOLN
1.0000 mg | INTRAMUSCULAR | Status: DC
  Administered 2021-11-08: 1 mg via INTRAVENOUS
  Filled 2021-11-08: qty 2

## 2021-11-08 MED ORDER — ONDANSETRON HCL 4 MG PO TABS: 4.0000 mg | ORAL_TABLET | Freq: Four times a day (QID) | ORAL | Status: DC | PRN

## 2021-11-08 MED ORDER — DEXAMETHASONE SODIUM PHOSPHATE 10 MG/ML IJ SOLN
INTRAMUSCULAR | Status: DC | PRN
  Administered 2021-11-08: 10 mg via INTRAVENOUS

## 2021-11-08 MED ORDER — ONDANSETRON HCL 4 MG/2ML IJ SOLN
INTRAMUSCULAR | Status: DC | PRN
  Administered 2021-11-08: 4 mg via INTRAVENOUS

## 2021-11-08 MED ORDER — ROCURONIUM BROMIDE 10 MG/ML (PF) SYRINGE
PREFILLED_SYRINGE | INTRAVENOUS | Status: DC | PRN
  Administered 2021-11-08: 50 mg via INTRAVENOUS

## 2021-11-08 MED ORDER — PHENYLEPHRINE 80 MCG/ML (10ML) SYRINGE FOR IV PUSH (FOR BLOOD PRESSURE SUPPORT)
PREFILLED_SYRINGE | INTRAVENOUS | Status: DC | PRN
  Administered 2021-11-08: 160 ug via INTRAVENOUS
  Administered 2021-11-08 (×2): 80 ug via INTRAVENOUS

## 2021-11-08 MED ORDER — BISACODYL 5 MG PO TBEC: 5.0000 mg | DELAYED_RELEASE_TABLET | Freq: Every day | ORAL | Status: DC | PRN

## 2021-11-08 MED ORDER — STERILE WATER FOR IRRIGATION IR SOLN
Status: DC | PRN
  Administered 2021-11-08: 2000 mL

## 2021-11-08 MED ORDER — HYDROCODONE-ACETAMINOPHEN 5-325 MG PO TABS
1.0000 | ORAL_TABLET | ORAL | Status: DC | PRN
  Administered 2021-11-09: 1 via ORAL
  Filled 2021-11-08: qty 2

## 2021-11-08 MED ORDER — ASPIRIN 81 MG PO CHEW
81.0000 mg | CHEWABLE_TABLET | Freq: Every day | ORAL | Status: DC
  Administered 2021-11-09: 81 mg via ORAL
  Filled 2021-11-08: qty 1

## 2021-11-08 MED ORDER — FENTANYL CITRATE PF 50 MCG/ML IJ SOSY
50.0000 ug | PREFILLED_SYRINGE | INTRAMUSCULAR | Status: DC
  Administered 2021-11-08: 50 ug via INTRAVENOUS
  Filled 2021-11-08: qty 2

## 2021-11-08 MED ORDER — BUPIVACAINE LIPOSOME 1.3 % IJ SUSP
INTRAMUSCULAR | Status: DC | PRN
  Administered 2021-11-08: 10 mL via PERINEURAL

## 2021-11-08 MED ORDER — EPHEDRINE SULFATE-NACL 50-0.9 MG/10ML-% IV SOSY
PREFILLED_SYRINGE | INTRAVENOUS | Status: DC | PRN
  Administered 2021-11-08 (×2): 5 mg via INTRAVENOUS

## 2021-11-08 MED ORDER — MAGNESIUM CITRATE PO SOLN: 1.0000 | Freq: Once | ORAL | Status: DC | PRN

## 2021-11-08 MED ORDER — PANTOPRAZOLE SODIUM 40 MG PO TBEC
40.0000 mg | DELAYED_RELEASE_TABLET | Freq: Every day | ORAL | Status: DC
  Administered 2021-11-08: 40 mg via ORAL
  Filled 2021-11-08 (×2): qty 1

## 2021-11-08 MED ORDER — LOSARTAN POTASSIUM 50 MG PO TABS
50.0000 mg | ORAL_TABLET | Freq: Every day | ORAL | Status: DC
  Filled 2021-11-08: qty 1

## 2021-11-08 MED ORDER — POLYETHYLENE GLYCOL 3350 17 G PO PACK: 17.0000 g | PACK | Freq: Every day | ORAL | Status: DC | PRN

## 2021-11-08 MED ORDER — METHOCARBAMOL 1000 MG/10ML IJ SOLN: 500.0000 mg | Freq: Four times a day (QID) | INTRAVENOUS | Status: DC | PRN

## 2021-11-08 MED ORDER — SUGAMMADEX SODIUM 200 MG/2ML IV SOLN
INTRAVENOUS | Status: DC | PRN
  Administered 2021-11-08: 200 mg via INTRAVENOUS

## 2021-11-08 MED ORDER — AMISULPRIDE (ANTIEMETIC) 5 MG/2ML IV SOLN: 10.0000 mg | Freq: Once | INTRAVENOUS | Status: DC | PRN

## 2021-11-08 MED ORDER — LEVOTHYROXINE SODIUM 88 MCG PO TABS
88.0000 ug | ORAL_TABLET | Freq: Every day | ORAL | Status: DC
  Filled 2021-11-08: qty 1

## 2021-11-08 MED ORDER — ORAL CARE MOUTH RINSE: 15.0000 mL | Freq: Once | OROMUCOSAL | Status: AC

## 2021-11-08 MED ORDER — METOCLOPRAMIDE HCL 5 MG PO TABS: 5.0000 mg | ORAL_TABLET | Freq: Three times a day (TID) | ORAL | Status: DC | PRN

## 2021-11-08 MED ORDER — CHLORHEXIDINE GLUCONATE 0.12 % MT SOLN
15.0000 mL | Freq: Once | OROMUCOSAL | Status: AC
  Administered 2021-11-08: 15 mL via OROMUCOSAL

## 2021-11-08 MED ORDER — FENTANYL CITRATE (PF) 100 MCG/2ML IJ SOLN
INTRAMUSCULAR | Status: AC
  Filled 2021-11-08: qty 2

## 2021-11-08 SURGICAL SUPPLY — 84 items
BAG COUNTER SPONGE SURGICOUNT (BAG) IMPLANT
BAG ZIPLOCK 12X15 (MISCELLANEOUS) ×2 IMPLANT
BLADE SAW SGTL 83.5X18.5 (BLADE) ×2 IMPLANT
BNDG COHESIVE 4X5 TAN STRL LF (GAUZE/BANDAGES/DRESSINGS) ×2 IMPLANT
COOLER ICEMAN CLASSIC (MISCELLANEOUS) ×2 IMPLANT
COVER BACK TABLE 60X90IN (DRAPES) ×2 IMPLANT
COVER SURGICAL LIGHT HANDLE (MISCELLANEOUS) ×2 IMPLANT
CUP SUT UNIV REVERS 36 NEUTRAL (Cup) IMPLANT
DERMABOND ADVANCED .7 DNX12 (GAUZE/BANDAGES/DRESSINGS) ×2 IMPLANT
DRAPE INCISE IOBAN 66X45 STRL (DRAPES) IMPLANT
DRAPE ORTHO SPLIT 77X108 STRL (DRAPES) ×4
DRAPE SHEET LG 3/4 BI-LAMINATE (DRAPES) ×2 IMPLANT
DRAPE STERI IOBAN 125X83 (DRAPES) ×2 IMPLANT
DRAPE SURG 17X11 SM STRL (DRAPES) ×2 IMPLANT
DRAPE SURG ORHT 6 SPLT 77X108 (DRAPES) ×4 IMPLANT
DRAPE TOP 10253 STERILE (DRAPES) ×2 IMPLANT
DRAPE U-SHAPE 47X51 STRL (DRAPES) ×2 IMPLANT
DRESSING AQUACEL AG SP 3.5X6 (GAUZE/BANDAGES/DRESSINGS) ×2 IMPLANT
DRSG AQUACEL AG ADV 3.5X10 (GAUZE/BANDAGES/DRESSINGS) IMPLANT
DRSG AQUACEL AG SP 3.5X6 (GAUZE/BANDAGES/DRESSINGS) ×2
DRSG EMULSION OIL 3X16 NADH (GAUZE/BANDAGES/DRESSINGS) ×2 IMPLANT
DRSG TEGADERM 8X12 (GAUZE/BANDAGES/DRESSINGS) ×2 IMPLANT
DURAPREP 26ML APPLICATOR (WOUND CARE) ×2 IMPLANT
ELECT BLADE TIP CTD 4 INCH (ELECTRODE) ×2 IMPLANT
ELECT PENCIL ROCKER SW 15FT (MISCELLANEOUS) ×2 IMPLANT
ELECT REM PT RETURN 15FT ADLT (MISCELLANEOUS) ×2 IMPLANT
FACESHIELD WRAPAROUND (MASK) ×8 IMPLANT
FACESHIELD WRAPAROUND OR TEAM (MASK) ×8 IMPLANT
FIBERTAPE CERCLAGE TLINK SUT (SUTURE) IMPLANT
GAUZE PAD ABD 8X10 STRL (GAUZE/BANDAGES/DRESSINGS) ×2 IMPLANT
GAUZE SPONGE 4X4 12PLY STRL (GAUZE/BANDAGES/DRESSINGS) ×2 IMPLANT
GLENOID UNI REV MOD 24 +2 LAT (Joint) IMPLANT
GLENOSPHERE 36 +4 LAT/24 (Joint) IMPLANT
GLOVE BIO SURGEON STRL SZ7.5 (GLOVE) ×2 IMPLANT
GLOVE BIO SURGEON STRL SZ8 (GLOVE) ×2 IMPLANT
GLOVE ECLIPSE 7.5 STRL STRAW (GLOVE) ×2 IMPLANT
GLOVE SS BIOGEL STRL SZ 7 (GLOVE) ×2 IMPLANT
GLOVE SS BIOGEL STRL SZ 7.5 (GLOVE) ×2 IMPLANT
GOWN STRL REIN XL XLG (GOWN DISPOSABLE) ×4 IMPLANT
INSERT HUMERAL UNI REVERS 36 6 (Insert) IMPLANT
KIT BASIN OR (CUSTOM PROCEDURE TRAY) ×2 IMPLANT
KIT TURNOVER KIT A (KITS) IMPLANT
MANIFOLD NEPTUNE II (INSTRUMENTS) ×2 IMPLANT
NDL TAPERED W/ NITINOL LOOP (MISCELLANEOUS) ×2 IMPLANT
NEEDLE TAPERED W/ NITINOL LOOP (MISCELLANEOUS) ×2 IMPLANT
NS IRRIG 1000ML POUR BTL (IV SOLUTION) ×2 IMPLANT
PACK GENERAL/GYN (CUSTOM PROCEDURE TRAY) ×2 IMPLANT
PACK SHOULDER (CUSTOM PROCEDURE TRAY) ×2 IMPLANT
PAD ARMBOARD 7.5X6 YLW CONV (MISCELLANEOUS) ×2 IMPLANT
PAD COLD SHLDR WRAP-ON (PAD) ×2 IMPLANT
PIN NITINOL TARGETER 2.8 (PIN) IMPLANT
PIN SET MODULAR GLENOID SYSTEM (PIN) IMPLANT
PROTECTOR NERVE ULNAR (MISCELLANEOUS) ×2 IMPLANT
RESTRAINT HEAD UNIVERSAL NS (MISCELLANEOUS) ×2 IMPLANT
SCREW CENTRAL MODULAR 25 (Screw) IMPLANT
SCREW PERIPHERAL 5.5X20 LOCK (Screw) IMPLANT
SCREW PERIPHERAL 5.5X28 LOCK (Screw) IMPLANT
SLING ARM FOAM STRAP LRG (SOFTGOODS) IMPLANT
SLING ARM FOAM STRAP MED (SOFTGOODS) IMPLANT
SPONGE T-LAP 18X18 ~~LOC~~+RFID (SPONGE) IMPLANT
SPONGE T-LAP 4X18 ~~LOC~~+RFID (SPONGE) ×2 IMPLANT
STAPLER VISISTAT 35W (STAPLE) IMPLANT
STEM HUMERAL UNIVER REV SIZE 7 (Stem) IMPLANT
STRIP CLOSURE SKIN 1/2X4 (GAUZE/BANDAGES/DRESSINGS) IMPLANT
SUCTION FRAZIER HANDLE 12FR (TUBING) ×2
SUCTION TUBE FRAZIER 12FR DISP (TUBING) ×2 IMPLANT
SUT FIBERWIRE #2 38 T-5 BLUE (SUTURE)
SUT MNCRL AB 3-0 PS2 18 (SUTURE) ×2 IMPLANT
SUT MNCRL AB 4-0 PS2 18 (SUTURE) IMPLANT
SUT MON AB 2-0 CT1 36 (SUTURE) ×2 IMPLANT
SUT VIC AB 1 CT1 27 (SUTURE) ×4
SUT VIC AB 1 CT1 27XBRD ANBCTR (SUTURE) IMPLANT
SUT VIC AB 1 CT1 27XBRD ANTBC (SUTURE) ×2 IMPLANT
SUT VIC AB 1 CT1 36 (SUTURE) ×2 IMPLANT
SUT VIC AB 2-0 CT1 27 (SUTURE) ×2
SUT VIC AB 2-0 CT1 TAPERPNT 27 (SUTURE) ×2 IMPLANT
SUTURE FIBERWR #2 38 T-5 BLUE (SUTURE) IMPLANT
SUTURE TAPE 1.3 40 TPR END (SUTURE) ×4 IMPLANT
SUTURETAPE 1.3 40 TPR END (SUTURE) ×4
TOWEL OR 17X26 10 PK STRL BLUE (TOWEL DISPOSABLE) ×2 IMPLANT
TOWEL OR NON WOVEN STRL DISP B (DISPOSABLE) ×2 IMPLANT
TUBE SUCTION HIGH CAP CLEAR NV (SUCTIONS) ×2 IMPLANT
WATER STERILE IRR 1000ML POUR (IV SOLUTION) ×4 IMPLANT
YANKAUER SUCT BULB TIP 10FT TU (MISCELLANEOUS) ×2 IMPLANT

## 2021-11-08 NOTE — Op Note (Signed)
11/08/2021  4:34 PM  PATIENT:   Diane Bauer  81 y.o. female  PRE-OPERATIVE DIAGNOSIS:  Left proximal humerus failed open reduction internal fixation  POST-OPERATIVE DIAGNOSIS: Same  PROCEDURE: Removal of retained hardware from left proximal humerus and conversion to a left shoulder reverse arthroplasty utilizing a press-fit size 7 Arthrex stem with a neutral metaphysis, +6 constrained polyethylene insert, 36/+4 glenosphere and a small/+2 baseplate  SURGEON:  Inda Mcglothen, Metta Clines M.D.  ASSISTANTS: Jenetta Loges, PA-C  Jenetta Loges, PA-C was utilized as an Environmental consultant throughout this case, essential for help with positioning the patient, positioning extremity, tissue manipulation, implantation of the prosthesis, suture management, wound closure, and intraoperative decision-making.  ANESTHESIA:   General endotracheal and interscalene block with Exparel  EBL: 200 cc  SPECIMEN: None  Drains: None   PATIENT DISPOSITION:  PACU - hemodynamically stable.    PLAN OF CARE: Admit for overnight observation  Brief history:  Patient is a 81 year old female status post mechanical fall sustaining a severely displaced left 2 part proximal humerus fracture for which we have performed an open reduction and internal fixation back on September 2.  On her follow-up visit in the office last week radiographs showed that there had been displacement with loss of fixation.  She is brought to the operating room at this time for planned removal of retained hardware and conversion to a reverse shoulder arthroplasty.  Preoperatively, I counseled the patient regarding treatment options and risks versus benefits thereof.  Possible surgical complications were all reviewed including potential for bleeding, infection, neurovascular injury, persistent pain, loss of motion, anesthetic complication, failure of the implant, and possible need for additional surgery. They understand and accept and agrees with our  planned procedure.   Procedure in detail:  After undergoing routine preop evaluation the patient received prophylactic antibiotics and interscalene block with Exparel was established in the holding area by the anesthesia department.  Subsequently placed supine on the operating table and underwent the smooth induction of a general endotracheal anesthesia.  Placed into the beachchair position and appropriately padded and protected.  The left shoulder girdle region was sterilely prepped and draped in standard fashion.  Timeout was called.  Her left shoulder incision was then reopened along the course of the previous incision using sharp dissection and the previously placed suture material resolved removed.  Vicryl's from the deltopectoral interval were then removed and the deltopectoral interval was then developed with blunt dissection from proximal to distal with the vein taken laterally.  Conjoined tendon mobilized and retracted medially.  Dissection carried out beneath the deltoid and this allowed access to the failed plate construct which was grossly loose.  The screws were all removed and the plate was then removed.  At this point we then tenodesed the long head bicep tendon at the upper border of the pectoralis major tendon and the proximal segment was then unroofed and excised and this served as the interval through which we introduced an osteotome to divide the lesser tuberosity from the humeral head articular segment and once this was completed using the osteotome and leaving a thin wafer of bone the subscapularis was then tagged with a pair of grasping suture tape sutures.  In a similar fashion the osteotome was then used to remove a 2 to 3 mm thick wafer of bone with the greater tuberosity and the rotator cuff attachment and this allowed [place] to then remove the humeral head from the joint.  The superior rotator cuff was tagged with a pair  of suture tape sutures as well.  We then confirmed that the  tuberosities were appropriate sized for ultimate repair back to the implant.  At this point the glenoid was visualized and we performed a circumferential labral resection.  A guidepin was then directed into the center of the glenoid and the glenoid was reamed with the central followed by the peripheral reamer to a stable subchondral bony bed.  Preparation completed with the central drill and tapped for a 25 mm lag screw.  Baseplate was then assembled and was inserted with vancomycin powder applied to the threads of the lag screw in good purchase was achieved.  We then placed all the peripheral locking screws using standard technique with good fixation.  A 36/+4 glenosphere was then impacted onto the baseplate and the central locking screw was placed.  We returned our attention back to the humeral shaft where the canal was opened by hand reaming and gently broached up to a size 7 and prior to broaching we did placed a small fiber cerclage around the metaphysis to protect the cortical bone and prevent distal propagation of a fracture.  We ultimately broached to a size 7 at approximately 30 degrees retroversion and a trial reduction showed good motion stability and soft tissue balance.  This point the trial was removed the final implant was assembled.  We terminally tightened the fiber cerclage and then terminally seated the size 7 stem at approximate 30 degrees retroversion.  Good purchase was achieved.  Then based on the fracture pattern of the humeral shaft we passed a second fiber cerclage more proximal than the first which allowed [place] to grasp onto the implant and the portions of bone extending proximally such that it "sandwiched" the implant between the anterior and posterior margins of cortical bone allowing further stabilization of the stem much to our satisfaction.  Trial reductions were then performed and ultimately felt that a +6 spacer gives the best motion stability and soft tissue balance.  This point the  trial was then removed.  The implant was cleaned and dried.  A +6 constrained poly was then impacted onto the implant.  Final reduction was performed which showed good motion good stability and good soft tissue balance.  We confirmed that the tuberosities were properly mobilized and positioned about the implant and they were then sutured back to the implant through the eyelets on the collar of the implant and also down to the humeral shaft utilizing the suture limbs from the fiber cerclage and then the tuberosities were sewn to 1 another overall given the semisolid construct with good positioning much to our satisfaction.  This point the wound was copiously irrigated.  Hemostasis was obtained.  The balance of the vancomycin powder was then spread liberally throughout the deep soft tissue planes.  The deltopectoral interval was reapproximated with a series of figure-of-eight number Vicryl sutures.  2-0 Monocryl used to close the subcu layer and intracuticular 3-0 Monocryl used to close the skin followed by Dermabond and Aquacel dressing.  The left arm was placed into a sling and the patient was awakened, extubated, and taken to the recovery room in stable condition.  Marin Shutter MD   Contact # [phone]

## 2021-11-08 NOTE — H&P (Signed)
[employer] claims handler    Chief Complaint: Left proximal humerus failed open reduction internal fixation HPI: The patient is a 81 y.o. female status post a mechanical fall sustaining a severely displaced left 2 part proximal humerus fracture.  We performed an original open reduction and internal fixation back on September 2 with overall good alignment demonstrated on intraoperative radiographs.  At the patient's first follow-up visit last week x-rays showed that there had been displacement and loss of fixation with the hardware loose and overall misalignment.  She is brought to the operating room at this time for planned conversion to a reverse shoulder arthroplasty with removal of the loose hardware.    Past Medical History:  Diagnosis Date   Bronchospasm    occassional   COPD (chronic obstructive pulmonary disease) ([place])    by CXR   Headache    migraines years ago   Hypertension    Hypothyroidism    Osteopenia    PONV (postoperative nausea and vomiting)       Past Surgical History:  Procedure Laterality Date   COLONOSCOPY     DILATION AND CURETTAGE OF UTERUS     x2, secondary to menorrhagia   ORIF HUMERUS FRACTURE Left 10/22/2021   Procedure: OPEN REDUCTION INTERNAL FIXATION (ORIF) PROXIMAL HUMERUS FRACTURE;  Surgeon: Justice Britain, MD;  Location: [facility] ORS;  Service: Orthopedics;  Laterality: Left;   ORIF PATELLA Right 10/21/2021   Procedure: OPEN REDUCTION INTERNAL FIXATION (ORIF) PATELLA;  Surgeon: Wylene Simmer, MD;  Location: [facility] ORS;  Service: Orthopedics;  Laterality: Right;    Family History  Problem Relation Age of Onset   Breast cancer Sister    Breast cancer Paternal Aunt     Social History:  reports that she has never smoked. She has never used smokeless tobacco. She reports that she does not drink alcohol and does not use drugs.  BMI: Estimated body mass index is 19.21 kg/m as calculated from the following:   Height as of this encounter: 5\' 6"  (1.676 m).    Weight as of this encounter: 54 kg.  Lab Results  Component Value Date   ALBUMIN 3.7 10/21/2021   Diabetes: Patient does not have a diagnosis of diabetes.     Smoking Status:   reports that she has never smoked. She has never used smokeless tobacco.     Medications Prior to Admission  Medication Sig Dispense Refill   amLODipine (NORVASC) 2.5 MG tablet Take 1 tablet (2.5 mg total) by mouth daily. (Patient taking differently: Take 2.5 mg by mouth See admin instructions. Take 2.5 mg by mouth in the morning and evening) 90 tablet 3   ascorbic acid (VITAMIN C) 500 MG tablet Take 500 mg by mouth daily.     aspirin 81 MG tablet Take 81 mg by mouth daily.     Cholecalciferol (VITAMIN D3) 50 MCG (2000 UT) TABS Take 4,000 Units by mouth daily.     fish oil-omega-3 fatty acids 1000 MG capsule Take 2 g by mouth daily.     losartan (COZAAR) 50 MG tablet Take 50 mg by mouth in the morning and at bedtime.     MAGNESIUM GLYCINATE PO Take 350 mg by mouth in the morning and at bedtime.     methocarbamol (ROBAXIN) 500 MG tablet Take 1 tablet (500 mg total) by mouth every 6 (six) hours as needed for muscle spasms. 30 tablet 0   OVER THE COUNTER MEDICATION Take 1 capsule by mouth daily. Triple Boron Supplement  Probiotic Product (PROBIOTIC PO) Take 1 capsule by mouth daily.     TIROSINT 88 MCG CAPS Take 88 mcg by mouth daily before breakfast.     vitamin B-12 (CYANOCOBALAMIN) 50 MCG tablet Take 50 mcg by mouth daily.       HYDROcodone-acetaminophen (NORCO/VICODIN) 5-325 MG tablet Take 1-2 tablets by mouth every 4 (four) hours as needed for moderate pain (pain score 4-6). (Patient not taking: Reported on 11/05/2021) 30 tablet 0     Physical Exam: Inspection of the shoulder demonstrates that the recent surgical incision is healing nicely with a Dermabond intact.  No erythema or induration.  Diffuse resolving ecchymosis is noted as would be expected.  Compartments are soft and she is overall neurovascular  intact.  Plain radiographs confirm loss of fixation with the plate and screw construct having separated from the humeral shaft.  Vitals  Temp:  [97.6 F (36.4 C)] 97.6 F (36.4 C) (09/19 1302) Pulse Rate:  [70-76] 70 (09/19 1357) Resp:  [14-18] 18 (09/19 1357) BP: (129-148)/(57-65) 129/57 (09/19 1357) SpO2:  [97 %-100 %] 97 % (09/19 1357) Weight:  [54 kg] 54 kg (09/19 1316)  Assessment/Plan  Impression: Left proximal humerus failed open reduction internal fixation  Plan of Action: Procedure(s): Left shoulder hardware removal and conversion to Reverse shoulder arthroplasty HARDWARE REMOVAL  Keron Neenan M Danyelle Brookover 11/08/2021, 2:07 PM Contact # [phone]

## 2021-11-08 NOTE — Anesthesia Preprocedure Evaluation (Signed)
Anesthesia Evaluation  Patient identified by MRN, date of birth, ID band Patient awake    Reviewed: Allergy & Precautions, NPO status , Patient's Chart, lab work & pertinent test results  History of Anesthesia Complications (+) PONV and history of anesthetic complications  Airway Mallampati: II  TM Distance: >3 FB Neck ROM: Full    Dental  (+) Dental Advisory Given, Teeth Intact, Caps   Pulmonary COPD,  Episode of bronchitis a few years ago, no issues since then    Pulmonary exam normal breath sounds clear to auscultation       Cardiovascular hypertension, Pt. on medications Normal cardiovascular exam Rhythm:Regular Rate:Normal     Neuro/Psych  Headaches, negative psych ROS   GI/Hepatic negative GI ROS, Neg liver ROS,   Endo/Other  Hypothyroidism   Renal/GU negative Renal ROS  negative genitourinary   Musculoskeletal negative musculoskeletal ROS (+)   Abdominal   Peds  Hematology negative hematology ROS (+) Hb 13.5, plt 203   Anesthesia Other Findings   Reproductive/Obstetrics negative OB ROS                             Anesthesia Physical  Anesthesia Plan  ASA: 2  Anesthesia Plan: General and Regional   Post-op Pain Management: Regional block* and Minimal or no pain anticipated   Induction: Intravenous  PONV Risk Score and Plan: 4 or greater and Ondansetron, Dexamethasone, Treatment may vary due to age or medical condition, TIVA and Droperidol  Airway Management Planned: Oral ETT  Additional Equipment: None  Intra-op Plan:   Post-operative Plan: Extubation in OR  Informed Consent: I have reviewed the patients History and Physical, chart, labs and discussed the procedure including the risks, benefits and alternatives for the proposed anesthesia with the patient or authorized representative who has indicated his/her understanding and acceptance.     Dental advisory  given  Plan Discussed with: CRNA  Anesthesia Plan Comments:         Anesthesia Quick Evaluation

## 2021-11-08 NOTE — Anesthesia Postprocedure Evaluation (Signed)
Anesthesia Post Note  Patient: Geophysical data processor  Procedure(s) Performed: Left shoulder hardware removal and conversion to Reverse shoulder arthroplasty (Left: Shoulder) HARDWARE REMOVAL (Left)     Patient location during evaluation: PACU Anesthesia Type: Regional and General Level of consciousness: awake and alert Pain management: pain level controlled Vital Signs Assessment: post-procedure vital signs reviewed and stable Respiratory status: spontaneous breathing, nonlabored ventilation and respiratory function stable Cardiovascular status: blood pressure returned to baseline and stable Postop Assessment: no apparent nausea or vomiting Anesthetic complications: no   No notable events documented.  Last Vitals:  Vitals:   11/08/21 1802 11/08/21 1830  BP: 122/62 (!) 118/59  Pulse: 80 77  Resp: 11   Temp:    SpO2: 92% 94%    Last Pain:  Vitals:   11/08/21 1802  TempSrc:   PainSc: 0-No pain                 Lynda Rainwater

## 2021-11-08 NOTE — OR Nursing (Signed)
1 PLATE AND 12 SCREWS REMOVED BY DR SUPPLE. ALL DISCARDED AT END OF CASE PER DR SUPPLE.

## 2021-11-08 NOTE — Anesthesia Procedure Notes (Signed)
Procedure Name: LMA Insertion Date/Time: 11/08/2021 2:44 PM  Performed by: Gerald Leitz, CRNAPre-anesthesia Checklist: Patient identified, Patient being monitored, Timeout performed, Emergency Drugs available and Suction available Patient Re-evaluated:Patient Re-evaluated prior to induction Oxygen Delivery Method: Circle system utilized Preoxygenation: Pre-oxygenation with 100% oxygen Induction Type: IV induction Ventilation: Mask ventilation without difficulty Grade View: Grade II Tube type: Oral Tube size: 7.0 mm Number of attempts: 1 Placement Confirmation: positive ETCO2 and breath sounds checked- equal and bilateral Secured at: 21 cm Tube secured with: Tape Dental Injury: Teeth and Oropharynx as per pre-operative assessment

## 2021-11-08 NOTE — Anesthesia Procedure Notes (Signed)
Anesthesia Regional Block: Interscalene brachial plexus block   Pre-Anesthetic Checklist: , timeout performed,  Correct Patient, Correct Site, Correct Laterality,  Correct Procedure, Correct Position, site marked,  Risks and benefits discussed,  Surgical consent,  Pre-op evaluation,  At surgeon's request and post-op pain management  Laterality: Left  Prep: chloraprep       Needles:  Injection technique: Single-shot  Needle Type: Stimiplex     Needle Length: 9cm  Needle Gauge: 21     Additional Needles:   Procedures:,,,, ultrasound used (permanent image in chart),,    Narrative:  Start time: 11/08/2021 1:58 PM End time: 11/08/2021 2:03 PM Injection made incrementally with aspirations every 5 mL.  Performed by: Personally  Anesthesiologist: Lynda Rainwater, MD

## 2021-11-08 NOTE — Transfer of Care (Signed)
Immediate Anesthesia Transfer of Care Note  Patient: Diane Bauer  Procedure(s) Performed: Procedure(s): Left shoulder hardware removal and conversion to Reverse shoulder arthroplasty (Left) HARDWARE REMOVAL (Left)  Patient Location: PACU  Anesthesia Type:General  Level of Consciousness: Alert, Awake, Oriented  Airway & Oxygen Therapy: Patient Spontanous Breathing  Post-op Assessment: Report given to RN  Post vital signs: Reviewed and stable  Last Vitals:  Vitals:   11/08/21 1352 11/08/21 1357  BP: (!) 148/65 (!) 129/57  Pulse: 76 70  Resp: 16 18  Temp:    SpO2: 462% 86%    Complications: No apparent anesthesia complications

## 2021-11-09 ENCOUNTER — Encounter (HOSPITAL_COMMUNITY): Payer: Self-pay | Admitting: Orthopedic Surgery

## 2021-11-09 DIAGNOSIS — T85698A Other mechanical complication of other specified internal prosthetic devices, implants and grafts, initial encounter: Secondary | ICD-10-CM | POA: Diagnosis not present

## 2021-11-09 MED ORDER — METHOCARBAMOL 500 MG PO TABS: 500.0000 mg | ORAL_TABLET | Freq: Four times a day (QID) | ORAL | 1 refills | Status: AC | PRN

## 2021-11-09 MED ORDER — HYDROCODONE-ACETAMINOPHEN 5-325 MG PO TABS: 1.0000 | ORAL_TABLET | ORAL | 0 refills | Status: DC | PRN

## 2021-11-09 MED ORDER — HYDROCODONE-ACETAMINOPHEN 5-325 MG PO TABS: 1.0000 | ORAL_TABLET | ORAL | 0 refills | Status: AC | PRN

## 2021-11-09 NOTE — Progress Notes (Signed)
  Transition of Care [facility]) Screening Note   Patient Details  Name: Diane Bauer Date of Birth: [birth year]   Transition of Care [facility]) CM/SW Contact:    Lennart Pall, LCSW Phone Number: 11/09/2021, 10:11 AM    Transition of Care Department [facility]) has reviewed patient and no TOC needs have been identified at this time. We will continue to monitor patient advancement through interdisciplinary progression rounds. If new patient transition needs arise, please place a TOC consult.

## 2021-11-09 NOTE — Discharge Summary (Signed)
PATIENT ID:      Diane Bauer  MRN:     [id number] DOB/AGE:    1940-11-26 / 81 y.o.     DISCHARGE SUMMARY  ADMISSION DATE:    11/08/2021 DISCHARGE DATE:  11/09/21  ADMISSION DIAGNOSIS: Left proximal humerus failed open reduction internal fixation Past Medical History:  Diagnosis Date   Bronchospasm    occassional   COPD (chronic obstructive pulmonary disease) ([place])    by CXR   Headache    migraines years ago   Hypertension    Hypothyroidism    Osteopenia    PONV (postoperative nausea and vomiting)     DISCHARGE DIAGNOSIS:   Principal Problem:   S/P reverse total shoulder arthroplasty, left   PROCEDURE: Procedure(s): Left shoulder hardware removal and conversion to Reverse shoulder arthroplasty HARDWARE REMOVAL on 11/08/2021  CONSULTS:    HISTORY:  See H&P in chart.  HOSPITAL COURSE:  Diane Bauer is a 81 y.o. admitted on 11/08/2021 with a diagnosis of Left proximal humerus failed open reduction internal fixation.  They were brought to the operating room on 11/08/2021 and underwent Procedure(s): Left shoulder hardware removal and conversion to Reverse shoulder arthroplasty HARDWARE REMOVAL.    They were given perioperative antibiotics:  Anti-infectives (From admission, onward)    Start     Dose/Rate Route Frequency Ordered Stop   11/09/21 0600  ceFAZolin (ANCEF) IVPB 2g/100 mL premix        2 g 200 mL/hr over 30 Minutes Intravenous On call to O.R. 11/08/21 1253 11/08/21 1508   11/08/21 1529  vancomycin (VANCOCIN) powder  Status:  Discontinued          As needed 11/08/21 1529 11/08/21 1814     .  Patient underwent the above named procedure and tolerated it well. The following day they were hemodynamically stable and pain was controlled on oral analgesics. They were neurovascularly intact to the operative extremity. OT was ordered and worked with patient per protocol. They were medically and orthopaedically stable for discharge on .    DIAGNOSTIC  STUDIES:  RECENT RADIOGRAPHIC STUDIES :  DG Shoulder Left  Result Date: 10/22/2021 CLINICAL DATA:  RI for humeral neck fracture. EXAM: LEFT SHOULDER - 2+ VIEW COMPARISON:  10/21/2021 FINDINGS: Two views study shows interval plate and screw fixation of the, left humeral neck fracture. Bony alignment is markedly improved in the interval. No findings to suggest immediate hardware complication. IMPRESSION: Status post ORIF for left humeral neck fracture. No evidence for immediate hardware complication. Electronically Signed   By: Misty Stanley M.D.   On: 10/22/2021 14:39   DG C-Arm 1-60 Min-No Report  Result Date: 10/22/2021 Fluoroscopy was utilized by the requesting physician.  No radiographic interpretation.   DG C-Arm 1-60 Min-No Report  Result Date: 10/22/2021 Fluoroscopy was utilized by the requesting physician.  No radiographic interpretation.   DG Knee 1-2 Views Right  Result Date: 10/21/2021 CLINICAL DATA:  Right patella surgery. EXAM: RIGHT KNEE - 1-2 VIEW COMPARISON:  10/21/2021. FINDINGS: Two fluoroscopic images were obtained intraoperatively. Total fluoroscopy time is 27 seconds. Dose: 1.37 mGy. Images demonstrate open reduction internal fixation of patellar fracture with improved alignment. Please see operative report for additional information. IMPRESSION: Intraoperative utilization of fluoroscopy. Electronically Signed   By: Brett Fairy M.D.   On: 10/21/2021 20:32   DG C-Arm 1-60 Min-No Report  Result Date: 10/21/2021 Fluoroscopy was utilized by the requesting physician.  No radiographic interpretation.   DG C-Arm 1-60 Min-No Report  Result  Date: 10/21/2021 Fluoroscopy was utilized by the requesting physician.  No radiographic interpretation.   CT Shoulder Left Wo Contrast  Result Date: 10/21/2021 CLINICAL DATA:  Shoulder fracture, trauma. EXAM: CT OF THE UPPER LEFT EXTREMITY WITHOUT CONTRAST TECHNIQUE: Multidetector CT imaging of the upper left extremity was performed according to  the standard protocol. RADIATION DOSE REDUCTION: This exam was performed according to the departmental dose-optimization program which includes automated exposure control, adjustment of the mA and/or kV according to patient size and/or use of iterative reconstruction technique. COMPARISON:  Radiograph earlier today FINDINGS: Bones/Joint/Cartilage Comminuted and displaced fracture involving proximal humerus. The dominant fracture plane involves the surgical neck. A posterior fracture component involves the posterior humeral head cortex, including the rotator cuff insertion. There is medial displacement of a 3.5 cm fracture fragment from the proximal metaphysis. Fracture approaches but does not involve the glenohumeral joint. There is likely involvement of the lower aspect of the bicipital groove. No acute fracture of the scapula, clavicle or acromion. There is a small shoulder lipohemarthrosis. Ligaments Suboptimally assessed by CT. Muscles and Tendons There is no rotator cuff muscle atrophy. Soft tissues Mild generalized soft tissue edema. No fracture of the included ribs. IMPRESSION: Comminuted and displaced fracture involving the proximal humerus. The dominant fracture plane involves the surgical neck. A posterior fracture component involves the posterior humeral head cortex. Fracture approaches but does not involve the glenohumeral joint. Electronically Signed   By: Narda Rutherford M.D.   On: 10/21/2021 18:36   DG Chest 1 View  Result Date: 10/21/2021 CLINICAL DATA:  Mechanical fall EXAM: CHEST  1 VIEW COMPARISON:  None Available. FINDINGS: The cardiomediastinal silhouette is within normal limits. There is no focal airspace consolidation. There is no pleural effusion. No pneumothorax. Biapical pleuroparenchymal scarring. Probable calcified granuloma in the medial right lower lung. There is a comminuted displaced fracture of the proximal humerus at the surgical neck. IMPRESSION: Comminuted displaced fracture  of the left proximal humerus at the surgical neck. No acute cardiopulmonary disease. Electronically Signed   By: Caprice Renshaw M.D.   On: 10/21/2021 16:21   DG Pelvis 1-2 Views  Result Date: 10/21/2021 CLINICAL DATA:  Mechanical fall EXAM: PELVIS - 1-2 VIEW COMPARISON:  None Available. FINDINGS: There is no evidence of acute fracture. Alignment is normal. 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 IMPRESSION: No evidence of acute fracture on single frontal view of the pelvis. Mild bilateral hip osteoarthritis. Electronically Signed   By: Caprice Renshaw M.D.   On: 10/21/2021 16:19   DG Knee Complete 4 Views Right  Result Date: 10/21/2021 CLINICAL DATA:  Pain and bruising after mechanical fall today. EXAM: RIGHT KNEE - COMPLETE 4+ VIEW COMPARISON:  None Available. FINDINGS: There is a displaced fracture of the mid patella with 1.8 cm distraction of fracture fragments. The fracture is minimally comminuted involving the inferior fracture fragment at the dominant fracture plane. Associated prepatellar soft tissue edema and small joint effusion. No additional fracture of the knee. Normal tibiofemoral alignment. IMPRESSION: Displaced mid-patellar fracture with 1.8 cm distraction of fracture fragments. Electronically Signed   By: Narda Rutherford M.D.   On: 10/21/2021 16:17   DG Shoulder Left  Result Date: 10/21/2021 CLINICAL DATA:  Pain, mechanical fall today.  Left shoulder pain. EXAM: LEFT SHOULDER - 2+ VIEW COMPARISON:  None Available. FINDINGS: Comminuted and displaced fracture through the proximal humerus. Dominant fracture plane involves the surgical neck. There is medial displacement of a 3.5 cm butterfly fragment. Glenohumeral joint is congruent. Acromioclavicular joint  is congruent. No visualized fractures of the included ribs. IMPRESSION: Comminuted and displaced proximal humerus fracture involving the surgical neck. Electronically Signed   By: Narda RutherfordMelanie  Sanford M.D.   On: 10/21/2021 16:15    RECENT VITAL  SIGNS:  Patient Vitals for the past 24 hrs:  BP Temp Temp src Pulse Resp SpO2 Height Weight  11/09/21 0508 129/62 97.7 F (36.5 C) Oral 73 14 95 % -- --  11/09/21 0023 (!) 116/59 97.7 F (36.5 C) Oral 73 14 95 % -- --  11/08/21 2032 (!) 117/55 97.6 F (36.4 C) Oral 79 14 92 % -- --  11/08/21 1830 (!) 118/59 -- -- 77 -- 94 % -- --  11/08/21 1802 122/62 -- -- 80 11 92 % -- --  11/08/21 1745 120/72 -- -- 79 15 93 % -- --  11/08/21 1730 124/67 -- -- 92 13 93 % -- --  11/08/21 1715 122/61 -- -- 83 14 100 % -- --  11/08/21 1700 (!) 111/49 -- -- 76 12 100 % -- --  11/08/21 1653 112/65 97.7 F (36.5 C) -- 76 15 100 % -- --  11/08/21 1357 (!) 129/57 -- -- 70 18 97 % -- --  11/08/21 1352 (!) 148/65 -- -- 76 16 100 % -- --  11/08/21 1347 -- -- -- 75 17 97 % -- --  11/08/21 1316 -- -- -- -- -- -- 5\' 6"  (1.676 m) 54 kg  11/08/21 1302 136/63 97.6 F (36.4 C) Oral 74 14 100 % -- --  .  RECENT EKG RESULTS:    Orders placed or performed during the hospital encounter of 10/21/21   ED EKG   ED EKG    DISCHARGE INSTRUCTIONS:    DISCHARGE MEDICATIONS:   Allergies as of 11/09/2021       Reactions   Other Other (See Comments)   VEGAN- NO MEAT, DAIRY, GELATIN, OR PRODUCTS SOURCED FROM ANIMALS   Augmentin [amoxicillin-pot Clavulanate] Diarrhea        Medication List     TAKE these medications    amLODipine 2.5 MG tablet Commonly known as: NORVASC Take 1 tablet (2.5 mg total) by mouth daily. What changed:  when to take this additional instructions   ascorbic acid 500 MG tablet Commonly known as: VITAMIN C Take 500 mg by mouth daily.   aspirin 81 MG tablet Take 81 mg by mouth daily.   fish oil-omega-3 fatty acids 1000 MG capsule Take 2 g by mouth daily.   HYDROcodone-acetaminophen 5-325 MG tablet Commonly known as: NORCO/VICODIN Take 1-2 tablets by mouth every 4 (four) hours as needed for moderate pain (pain score 4-6).   losartan 50 MG tablet Commonly known as:  COZAAR Take 50 mg by mouth in the morning and at bedtime.   MAGNESIUM GLYCINATE PO Take 350 mg by mouth in the morning and at bedtime.   methocarbamol 500 MG tablet Commonly known as: ROBAXIN Take 1 tablet (500 mg total) by mouth every 6 (six) hours as needed for muscle spasms.   OVER THE COUNTER MEDICATION Take 1 capsule by mouth daily. Triple Boron Supplement   PROBIOTIC PO Take 1 capsule by mouth daily.   Tirosint 88 MCG Caps Generic drug: Levothyroxine Sodium Take 88 mcg by mouth daily before breakfast.   vitamin B-12 50 MCG tablet Commonly known as: CYANOCOBALAMIN Take 50 mcg by mouth daily.   Vitamin D3 50 MCG (2000 UT) Tabs Take 4,000 Units by mouth daily.  FOLLOW UP VISIT:    Follow-up Information     Francena Hanly, MD Follow up.   Specialty: Orthopedic Surgery Why: 11-23-2021 at 9:00 AM for -post-op Contact information: [address] [place] [address] [phone]                 DISCHARGE TO: home   DISCHARGE CONDITION:  Eustaquio Maize Juleen Sorrels for Dr. Francena Hanly 11/09/2021, 8:06 AM

## 2021-11-09 NOTE — Discharge Instructions (Signed)
 Kevin M. Supple, M.D., F.A.A.O.S. Orthopaedic Surgery Specializing in Arthroscopic and Reconstructive Surgery of the Shoulder [phone] [address] - [place], [place] [address] - Fax [phone]   POST-OP TOTAL SHOULDER REPLACEMENT INSTRUCTIONS  1. Follow up in the office for your first post-op appointment 10-14 days from the date of your surgery. If you do not already have a scheduled appointment, our office will contact you to schedule.  2. The bandage over your incision is waterproof. You may begin showering with this dressing on. You may leave this dressing on until first follow up appointment within 2 weeks. We prefer you leave this dressing in place until follow up however after 5-7 days if you are having itching or skin irritation and would like to remove it you may do so. Go slow and tug at the borders gently to break the bond the dressing has with the skin. At this point if there is no drainage it is okay to go without a bandage or you may cover it with a light guaze and tape. You can also expect significant bruising around your shoulder that will drift down your arm and into your chest wall. This is very normal and should resolve over several days.   3. Wear your sling/immobilizer at all times except to perform the exercises below or to occasionally let your arm dangle by your side to stretch your elbow. You also need to sleep in your sling immobilizer until instructed otherwise. It is ok to remove your sling if you are sitting in a controlled environment and allow your arm to rest in a position of comfort by your side or on your lap with pillows to give your neck and skin a break from the sling. You may remove it to allow arm to dangle by side to shower. If you are up walking around and when you go to sleep at night you need to wear it.  4. Range of motion to your elbow, wrist, and hand are encouraged 3-5 times daily. Exercise to your hand and fingers helps to reduce  swelling you may experience.   5. Prescriptions for a pain medication and a muscle relaxant are provided for you. It is recommended that if you are experiencing pain that you pain medication alone is not controlling, add the muscle relaxant along with the pain medication which can give additional pain relief. The first 1-2 days is generally the most severe of your pain and then should gradually decrease. As your pain lessens it is recommended that you decrease your use of the pain medications to an "as needed basis'" only and to always comply with the recommended dosages of the pain medications.  6. Pain medications can produce constipation along with their use. If you experience this, the use of an over the counter stool softener or laxative daily is recommended.   7. For additional questions or concerns, please do not hesitate to call the office. If after hours there is an answering service to forward your concerns to the physician on call.  8.Pain control following an exparel block  To help control your post-operative pain you received a nerve block  performed with Exparel which is a long acting anesthetic (numbing agent) which can provide pain relief and sensations of numbness (and relief of pain) in the operative shoulder and arm for up to 3 days. Sometimes it provides mixed relief, meaning you may still have numbness in certain areas of the arm but can still be able to   move  parts of that arm, hand, and fingers. We recommend that your prescribed pain medications  be used as needed. We do not feel it is necessary to "pre medicate" and "stay ahead" of pain.  Taking narcotic pain medications when you are not having any pain can lead to unnecessary and potentially dangerous side effects.    9. Use the ice machine as much as possible in the first 5-7 days from surgery, then you can wean its use to as needed. The ice typically needs to be replaced every 6 hours, instead of ice you can actually freeze  water bottles to put in the cooler and then fill water around them to avoid having to purchase ice. You can have spare water bottles freezing to allow you to rotate them once they have melted. Try to have a thin shirt or light cloth or towel under the ice wrap to protect your skin.   FOR ADDITIONAL INFO ON ICE MACHINE AND INSTRUCTIONS GO TO THE WEBSITE AT  [email or web address]  10.  We recommend that you avoid any dental work or cleaning in the first 3 months following your joint replacement. This is to help minimize the possibility of infection from the bacteria in your mouth that enters your bloodstream during dental work. We also recommend that you take an antibiotic prior to your dental work for the first year after your shoulder replacement to further help reduce that risk. Please simply contact our office for antibiotics to be sent to your pharmacy prior to dental work.  11. Dental Antibiotics:  We recommend waiting at least 3 months for any dental work even cleanings unless there is a IT consultant. We also recommend  prophylactic antibiotics for all dental procdeures  the first year following your joint replacement. In some exceptions we recommend them to be used lifelong. We will provide you with that prescription in follow up office visits, or you can call our office.  Exceptions are as follows:  1. History of prior total joint infection  2. Severely immunocompromised (Organ Transplant, cancer chemotherapy, Rheumatoid biologic meds such as [place])  3. Poorly controlled diabetes (A1C &gt; 8.0, blood glucose over 200)   POST-OP EXERCISES  OK to allow arm to dangle and mone elbow wrist and hand, no shoulder exercises

## 2021-11-09 NOTE — Evaluation (Signed)
Occupational Therapy Evaluation Patient Details Name: Diane Bauer MRN: [id number] DOB: August 05, 1940 Today's Date: 11/09/2021   History of Present Illness Patient is a 81 year old female s/p reverse total shoulder arthroplasty   Clinical Impression   Patient is a 81 year old woman s/p  reverse total shoulder arthroplasty without functional use of Left upper extremity secondary to effects of surgery and interscalene block and shoulder precautions. Therapist provided education and instruction to patient in regards to exercises, precautions, positioning, donning upper extremity clothing and bathing while maintaining shoulder precautions, ice and edema management and donning/doffing sling. Patient required assistance to donn ice cuff, and was educated that her son would need to assist PRN. Patient verbalized understanding and demonstrated as needed. Patient needed assistance to donn underwear and pants due to R knee brace. Patient was provided with instruction on compensatory strategies to perform ADLs. Patient to follow up with MD for further therapy needs.       Recommendations for follow up therapy are one component of a multi-disciplinary discharge planning process, led by the attending physician.  Recommendations may be updated based on patient status, additional functional criteria and insurance authorization.   Follow Up Recommendations  No OT follow up    Assistance Recommended at Discharge    Patient can return home with the following A little help with bathing/dressing/bathroom;Assistance with cooking/housework;Assist for transportation    Functional Status Assessment  Patient has had a recent decline in their functional status and demonstrates the ability to make significant improvements in function in a reasonable and predictable amount of time.  Equipment Recommendations  None recommended by OT    Recommendations for Other Services       Precautions / Restrictions  Precautions Precautions: Shoulder Type of Shoulder Precautions: NO AROM/PROM of shoulder, NWB LUE. AROM for hand wrist and elbow exercises. sling worn 24/7 ulness dressing, exercises, or bathing. Patient allowed to dangle L arm for comfort and bathing/dressing tasks. Shoulder Interventions: Shoulder sling/immobilizer;Off for dressing/bathing/exercises;At all times Precaution Booklet Issued: Yes (comment) Required Braces or Orthoses: Knee Immobilizer - Right Knee Immobilizer - Right: On at all times Restrictions Weight Bearing Restrictions: Yes LUE Weight Bearing: Non weight bearing RLE Weight Bearing: Weight bearing as tolerated      Mobility Bed Mobility Overal bed mobility: Needs Assistance Bed Mobility: Supine to Sit     Supine to sit: Supervision          Transfers Overall transfer level: Needs assistance Equipment used: None Transfers: Sit to/from Stand Sit to Stand: Supervision                  Balance Overall balance assessment: Mild deficits observed, not formally tested Sitting-balance support: No upper extremity supported Sitting balance-Leahy Scale: Good       Standing balance-Leahy Scale: Fair                             ADL either performed or assessed with clinical judgement   ADL Overall ADL's : Needs assistance/impaired                 Upper Body Dressing : Set up;Sitting   Lower Body Dressing: Set up;Sit to/from stand                       Vision Baseline Vision/History: 1 Wears glasses Patient Visual Report: No change from baseline       Perception  Praxis      Pertinent Vitals/Pain Pain Assessment Pain Assessment: 0-10 Pain Score: 2  Pain Location: L shoulder Pain Descriptors / Indicators: Discomfort Pain Intervention(s): Patient requesting pain meds-RN notified     Hand Dominance Right   Extremity/Trunk Assessment Upper Extremity Assessment Upper Extremity Assessment: LUE  deficits/detail;RUE deficits/detail RUE Deficits / Details: WFL for ROM and functional tasks RUE Sensation: WNL RUE Coordination: WNL LUE Deficits / Details: NO ROM of shoulder allowed per orders. Patient able to complete hand  and and wrist exercises. Patient lacks 30 degress tricep extention, educated patient on extending tricep by leaning forward to perform elbow exercise effectively. LUE: Unable to fully assess due to immobilization;Shoulder pain at rest       Cervical / Trunk Assessment Cervical / Trunk Assessment: Normal   Communication Communication Communication: No difficulties   Cognition Arousal/Alertness: Awake/alert Behavior During Therapy: WFL for tasks assessed/performed Overall Cognitive Status: Within Functional Limits for tasks assessed                                       General Comments       Exercises     Shoulder Instructions Shoulder Instructions Donning/doffing shirt without moving shoulder: Independent Method for sponge bathing under operated UE: Independent Donning/doffing sling/immobilizer: Independent Correct positioning of sling/immobilizer: Independent ROM for elbow, wrist and digits of operated UE:  (patient to complete hand, wrist, and elbow exercises 3x a day 10x each.) Sling wearing schedule (on at all times/off for ADL's): Independent Proper positioning of operated UE when showering: Independent Dressing change: [place] expects to be discharged to:: Private residence Living Arrangements: Children Available Help at Discharge: Family Type of Home: House Home Access: Level entry;Elevator     Home Layout: One level     Bathroom Shower/Tub: Occupational psychologist: [place] - single point;Wheelchair - manual          Prior Functioning/Environment Prior Level of Function : Independent/Modified Independent                        OT  Problem List:        OT Treatment/Interventions:      OT Goals(Current goals can be found in the care plan section) Acute Rehab OT Goals OT Goal Formulation: All assessment and education complete, DC therapy  OT Frequency:      Co-evaluation              AM-PAC OT "6 Clicks" Daily Activity     Outcome Measure Help from another person eating meals?: None Help from another person taking care of personal grooming?: A Little Help from another person toileting, which includes using toliet, bedpan, or urinal?: A Little Help from another person bathing (including washing, rinsing, drying)?: A Little Help from another person to put on and taking off regular upper body clothing?: A Little Help from another person to put on and taking off regular lower body clothing?: A Little 6 Click Score: 19   End of Session    Activity Tolerance: Patient tolerated treatment well Patient left: in chair;with call bell/phone within reach;with chair alarm set                   Time: 3220-2542 OT Time Calculation (min): 48 min Charges:  OT General Charges $  OT Visit: 1 Visit OT Evaluation $OT Eval Low Complexity: 1 Low OT Treatments $Self Care/Home Management : 23-37 mins  Jolly Mango, OTS Acute rehab services'   Jolly Mango 11/09/2021, 11:56 AM

## 2021-11-09 NOTE — Progress Notes (Signed)
Provided discharge education/instructions, all questions and concerns addressed. Pt not in acute distress, discharged home with belongings accompanied by son. 

## 2021-11-09 NOTE — Plan of Care (Signed)
  Problem: Education: Goal: Knowledge of the prescribed therapeutic regimen will improve Outcome: Progressing   Problem: Activity: Goal: Ability to tolerate increased activity will improve Outcome: Progressing   Problem: Pain Management: Goal: Pain level will decrease with appropriate interventions Outcome: Progressing   Problem: Health Behavior/Discharge Planning: Goal: Ability to manage health-related needs will improve Outcome: Progressing   Problem: Clinical Measurements: Goal: Ability to maintain clinical measurements within normal limits will improve Outcome: Progressing
